# Patient Record
Sex: Female | Born: 1960 | Race: White | Hispanic: No | Smoking: Former smoker
Health system: Southern US, Community
[De-identification: ages and names within clinical notes are randomized; demographics above are authoritative.]

## PROBLEM LIST (undated history)

## (undated) DIAGNOSIS — E785 Hyperlipidemia, unspecified: Secondary | ICD-10-CM

## (undated) DIAGNOSIS — K5900 Constipation, unspecified: Secondary | ICD-10-CM

## (undated) DIAGNOSIS — M1990 Unspecified osteoarthritis, unspecified site: Secondary | ICD-10-CM

## (undated) DIAGNOSIS — R42 Dizziness and giddiness: Secondary | ICD-10-CM

## (undated) DIAGNOSIS — T7840XA Allergy, unspecified, initial encounter: Secondary | ICD-10-CM

## (undated) HISTORY — DX: Constipation, unspecified: K59.00

## (undated) HISTORY — PX: SHOULDER ARTHROSCOPY: SHX128

## (undated) HISTORY — DX: Unspecified osteoarthritis, unspecified site: M19.90

## (undated) HISTORY — PX: BUNIONECTOMY: SHX129

## (undated) HISTORY — DX: Dizziness and giddiness: R42

## (undated) HISTORY — PX: BREAST BIOPSY: SHX20

## (undated) HISTORY — PX: HIP SURGERY: SHX245

## (undated) HISTORY — DX: Allergy, unspecified, initial encounter: T78.40XA

## (undated) HISTORY — PX: COLONOSCOPY: SHX174

## (undated) HISTORY — DX: Hyperlipidemia, unspecified: E78.5

## (undated) HISTORY — PX: CARPAL TUNNEL RELEASE: SHX101

## (undated) HISTORY — PX: TOTAL HIP ARTHROPLASTY: SHX124

---

## 1984-09-05 HISTORY — PX: ABDOMINAL HYSTERECTOMY: SHX81

## 1998-01-07 ENCOUNTER — Inpatient Hospital Stay (HOSPITAL_COMMUNITY): Admission: RE | Admit: 1998-01-07 | Discharge: 1998-01-15 | Payer: Self-pay | Admitting: *Deleted

## 1999-02-04 ENCOUNTER — Other Ambulatory Visit: Admission: RE | Admit: 1999-02-04 | Discharge: 1999-02-04 | Payer: Self-pay | Admitting: Gynecology

## 2000-02-08 ENCOUNTER — Encounter: Admission: RE | Admit: 2000-02-08 | Discharge: 2000-02-08 | Payer: Self-pay | Admitting: Gynecology

## 2000-02-08 ENCOUNTER — Encounter: Payer: Self-pay | Admitting: Gynecology

## 2000-02-21 ENCOUNTER — Other Ambulatory Visit: Admission: RE | Admit: 2000-02-21 | Discharge: 2000-02-21 | Payer: Self-pay | Admitting: Gynecology

## 2000-08-02 ENCOUNTER — Other Ambulatory Visit: Admission: RE | Admit: 2000-08-02 | Discharge: 2000-08-02 | Payer: Self-pay | Admitting: Gastroenterology

## 2000-08-02 ENCOUNTER — Encounter (INDEPENDENT_AMBULATORY_CARE_PROVIDER_SITE_OTHER): Payer: Self-pay | Admitting: Specialist

## 2001-02-26 ENCOUNTER — Encounter: Admission: RE | Admit: 2001-02-26 | Discharge: 2001-02-26 | Payer: Self-pay | Admitting: Gynecology

## 2001-02-26 ENCOUNTER — Encounter: Payer: Self-pay | Admitting: Gynecology

## 2001-02-28 ENCOUNTER — Encounter: Admission: RE | Admit: 2001-02-28 | Discharge: 2001-02-28 | Payer: Self-pay | Admitting: Gynecology

## 2001-02-28 ENCOUNTER — Encounter: Payer: Self-pay | Admitting: Gynecology

## 2002-02-27 ENCOUNTER — Other Ambulatory Visit: Admission: RE | Admit: 2002-02-27 | Discharge: 2002-02-27 | Payer: Self-pay | Admitting: Gynecology

## 2002-03-04 ENCOUNTER — Encounter: Admission: RE | Admit: 2002-03-04 | Discharge: 2002-03-04 | Payer: Self-pay | Admitting: Gynecology

## 2002-03-04 ENCOUNTER — Encounter: Payer: Self-pay | Admitting: Gynecology

## 2003-01-23 ENCOUNTER — Observation Stay (HOSPITAL_COMMUNITY): Admission: RE | Admit: 2003-01-23 | Discharge: 2003-01-24 | Payer: Self-pay | Admitting: Gynecology

## 2003-05-16 ENCOUNTER — Encounter: Admission: RE | Admit: 2003-05-16 | Discharge: 2003-05-16 | Payer: Self-pay | Admitting: Gynecology

## 2003-05-16 ENCOUNTER — Encounter: Payer: Self-pay | Admitting: Gynecology

## 2004-03-01 ENCOUNTER — Other Ambulatory Visit: Admission: RE | Admit: 2004-03-01 | Discharge: 2004-03-01 | Payer: Self-pay | Admitting: Gynecology

## 2004-03-03 ENCOUNTER — Encounter: Admission: RE | Admit: 2004-03-03 | Discharge: 2004-03-03 | Payer: Self-pay | Admitting: Gynecology

## 2005-03-25 ENCOUNTER — Encounter: Admission: RE | Admit: 2005-03-25 | Discharge: 2005-03-25 | Payer: Self-pay | Admitting: Gynecology

## 2005-04-07 ENCOUNTER — Other Ambulatory Visit: Admission: RE | Admit: 2005-04-07 | Discharge: 2005-04-07 | Payer: Self-pay | Admitting: Gynecology

## 2005-06-22 ENCOUNTER — Ambulatory Visit (HOSPITAL_COMMUNITY): Admission: RE | Admit: 2005-06-22 | Discharge: 2005-06-22 | Payer: Self-pay | Admitting: Orthopedic Surgery

## 2006-04-04 ENCOUNTER — Encounter: Admission: RE | Admit: 2006-04-04 | Discharge: 2006-04-04 | Payer: Self-pay | Admitting: Gynecology

## 2006-04-10 ENCOUNTER — Other Ambulatory Visit: Admission: RE | Admit: 2006-04-10 | Discharge: 2006-04-10 | Payer: Self-pay | Admitting: Gynecology

## 2007-03-15 DIAGNOSIS — N6019 Diffuse cystic mastopathy of unspecified breast: Secondary | ICD-10-CM | POA: Insufficient documentation

## 2007-03-19 ENCOUNTER — Ambulatory Visit: Payer: Self-pay | Admitting: Family Medicine

## 2007-03-21 LAB — CONVERTED CEMR LAB
ALT: 17 units/L (ref 0–35)
Albumin: 4 g/dL (ref 3.5–5.2)
Basophils Relative: 0.1 % (ref 0.0–1.0)
Bilirubin, Direct: 0.1 mg/dL (ref 0.0–0.3)
Calcium: 9.3 mg/dL (ref 8.4–10.5)
Cholesterol: 235 mg/dL (ref 0–200)
Direct LDL: 173.2 mg/dL
Eosinophils Absolute: 0 10*3/uL (ref 0.0–0.6)
Eosinophils Relative: 0.2 % (ref 0.0–5.0)
GFR calc Af Amer: 115 mL/min
Glucose, Bld: 96 mg/dL (ref 70–99)
Hemoglobin: 14.9 g/dL (ref 12.0–15.0)
Lymphocytes Relative: 19.6 % (ref 12.0–46.0)
Neutro Abs: 7.1 10*3/uL (ref 1.4–7.7)
Neutrophils Relative %: 76.6 % (ref 43.0–77.0)
RBC: 4.67 M/uL (ref 3.87–5.11)
Sodium: 142 meq/L (ref 135–145)
TSH: 0.88 microintl units/mL (ref 0.35–5.50)
Total CHOL/HDL Ratio: 5.1
Triglycerides: 75 mg/dL (ref 0–149)
WBC: 9.2 10*3/uL (ref 4.5–10.5)

## 2007-03-28 ENCOUNTER — Ambulatory Visit: Payer: Self-pay | Admitting: Family Medicine

## 2007-03-28 DIAGNOSIS — E7800 Pure hypercholesterolemia, unspecified: Secondary | ICD-10-CM | POA: Insufficient documentation

## 2007-04-09 ENCOUNTER — Encounter: Admission: RE | Admit: 2007-04-09 | Discharge: 2007-04-09 | Payer: Self-pay | Admitting: Gynecology

## 2007-04-17 ENCOUNTER — Other Ambulatory Visit: Admission: RE | Admit: 2007-04-17 | Discharge: 2007-04-17 | Payer: Self-pay | Admitting: Gynecology

## 2007-04-30 ENCOUNTER — Ambulatory Visit: Payer: Self-pay | Admitting: Internal Medicine

## 2007-05-02 LAB — CONVERTED CEMR LAB
AST: 17 units/L (ref 0–37)
Cholesterol: 156 mg/dL (ref 0–200)
Triglycerides: 64 mg/dL (ref 0–149)

## 2007-09-17 ENCOUNTER — Ambulatory Visit: Payer: Self-pay | Admitting: Family Medicine

## 2007-09-27 LAB — CONVERTED CEMR LAB
AST: 14 units/L (ref 0–37)
Albumin: 3.7 g/dL (ref 3.5–5.2)
Alkaline Phosphatase: 34 units/L — ABNORMAL LOW (ref 39–117)
Bilirubin, Direct: 0.1 mg/dL (ref 0.0–0.3)
Cholesterol: 151 mg/dL (ref 0–200)
Total Bilirubin: 0.9 mg/dL (ref 0.3–1.2)
VLDL: 9 mg/dL (ref 0–40)

## 2008-03-24 ENCOUNTER — Ambulatory Visit: Payer: Self-pay | Admitting: Family Medicine

## 2008-03-25 LAB — CONVERTED CEMR LAB
ALT: 25 units/L (ref 0–35)
AST: 25 units/L (ref 0–37)
HDL: 42.8 mg/dL (ref >39.0)
Total Protein: 6.6 g/dL (ref 6.0–8.3)
VLDL: 14 mg/dL (ref 0–40)

## 2008-04-09 ENCOUNTER — Encounter: Admission: RE | Admit: 2008-04-09 | Discharge: 2008-04-09 | Payer: Self-pay | Admitting: Gynecology

## 2008-05-01 ENCOUNTER — Encounter (INDEPENDENT_AMBULATORY_CARE_PROVIDER_SITE_OTHER): Payer: Self-pay | Admitting: Internal Medicine

## 2008-09-23 ENCOUNTER — Ambulatory Visit: Payer: Self-pay | Admitting: Family Medicine

## 2008-09-25 LAB — CONVERTED CEMR LAB
Albumin: 4 g/dL (ref 3.5–5.2)
Alkaline Phosphatase: 36 units/L — ABNORMAL LOW (ref 39–117)
Bilirubin, Direct: 0.1 mg/dL (ref 0.0–0.3)
Cholesterol: 154 mg/dL (ref 0–200)
HDL: 47.4 mg/dL (ref >39.0)
Total CHOL/HDL Ratio: 3.2

## 2008-10-17 ENCOUNTER — Ambulatory Visit: Payer: Self-pay | Admitting: Internal Medicine

## 2009-03-30 ENCOUNTER — Ambulatory Visit: Payer: Self-pay | Admitting: Family Medicine

## 2009-03-31 LAB — CONVERTED CEMR LAB
AST: 21 units/L (ref 0–37)
Cholesterol: 142 mg/dL (ref 0–200)
LDL Cholesterol: 86 mg/dL (ref 0–99)
Triglycerides: 48 mg/dL (ref 0.0–149.0)
VLDL: 9.6 mg/dL (ref 0.0–40.0)

## 2009-04-05 LAB — CONVERTED CEMR LAB: Pap Smear: NORMAL

## 2009-04-10 ENCOUNTER — Encounter: Admission: RE | Admit: 2009-04-10 | Discharge: 2009-04-10 | Payer: Self-pay | Admitting: Gynecology

## 2009-05-04 ENCOUNTER — Encounter (INDEPENDENT_AMBULATORY_CARE_PROVIDER_SITE_OTHER): Payer: Self-pay | Admitting: Internal Medicine

## 2009-05-18 ENCOUNTER — Encounter (INDEPENDENT_AMBULATORY_CARE_PROVIDER_SITE_OTHER): Payer: Self-pay | Admitting: Internal Medicine

## 2009-10-05 ENCOUNTER — Ambulatory Visit: Payer: Self-pay | Admitting: Family Medicine

## 2009-10-06 LAB — CONVERTED CEMR LAB: VLDL: 12.2 mg/dL (ref 0.0–40.0)

## 2009-10-16 ENCOUNTER — Ambulatory Visit: Payer: Self-pay | Admitting: Family Medicine

## 2009-10-16 DIAGNOSIS — J019 Acute sinusitis, unspecified: Secondary | ICD-10-CM

## 2009-10-16 DIAGNOSIS — J309 Allergic rhinitis, unspecified: Secondary | ICD-10-CM | POA: Insufficient documentation

## 2009-10-16 LAB — HM COLONOSCOPY: HM Colonoscopy: ABNORMAL

## 2010-04-12 ENCOUNTER — Encounter: Admission: RE | Admit: 2010-04-12 | Discharge: 2010-04-12 | Payer: Self-pay | Admitting: Gynecology

## 2010-04-16 ENCOUNTER — Ambulatory Visit: Payer: Self-pay | Admitting: Family Medicine

## 2010-04-27 LAB — CONVERTED CEMR LAB
Alkaline Phosphatase: 39 units/L (ref 39–117)
Bilirubin, Direct: 0.1 mg/dL (ref 0.0–0.3)
Calcium: 9.6 mg/dL (ref 8.4–10.5)
Chloride: 109 meq/L (ref 96–112)
Cholesterol: 140 mg/dL (ref 0–200)
Creatinine, Ser: 0.7 mg/dL (ref 0.4–1.2)
GFR calc non Af Amer: 89.12 mL/min (ref >60)
HDL: 50.8 mg/dL (ref >39.00)
LDL Cholesterol: 81 mg/dL (ref 0–99)
Potassium: 4.5 meq/L (ref 3.5–5.1)
Sodium: 145 meq/L (ref 135–145)
Total Bilirubin: 0.5 mg/dL (ref 0.3–1.2)
Total CHOL/HDL Ratio: 3
Triglycerides: 42 mg/dL (ref 0.0–149.0)
VLDL: 8.4 mg/dL (ref 0.0–40.0)

## 2010-05-06 ENCOUNTER — Encounter: Payer: Self-pay | Admitting: Family Medicine

## 2010-10-07 NOTE — Letter (Signed)
Summary: Beather Arbour MD  Beather Arbour MD   Imported By: Lanelle Bal 05/19/2010 08:19:50  _____________________________________________________________________  External Attachment:    Type:   Image     Comment:   External Document

## 2010-10-07 NOTE — Assessment & Plan Note (Signed)
Summary: TRANSFER FROM [facility] / [facility] / [facility]   Vital Signs:  Patient profile:   50 year old female Height:      63.5 inches Weight:      163.0 pounds BMI:     28.52 Temp:     98.2 degrees F oral Pulse rate:   68 / minute Pulse rhythm:   regular BP sitting:   90 / 60  (left arm) Cuff size:   regular  Vitals Entered By: Benny Lennert CMA Duncan Dull) (October 16, 2009 10:11 AM)  History of Present Illness: Chief complaint transfer from [facility]  Has had sinus issues this winter...last given antibitoic Amox  and nasal spray in 09/2009...Marland Kitchenhelped some but not completely. Has had congestion constant ,occ bloddy mucus, fraontal pain and sinus pressure. Has gotten worse in last week..face pain.    Preventive Screening-Counseling & Management  Alcohol-Tobacco     Smoking Status: current  Problems Prior to Update: 1)  Hypercholesterolemia, Pure  (ICD-272.0) 2)  Well Woman  (ICD-V70.0) 3)  Colonic Polyps, Benign, 07/2000  (ICD-211.3) 4)  Fibrocystic Breast Disease  (ICD-610.1) 5)  Nicotine Addiction, ? 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 Once Daily 2)  Adult Aspirin Ec Low Strength 81 Mg  Tbec (Aspirin) .... Once Daily 3)  Clarinex 5 Mg  Tabs (Desloratadine) .... As Needed 4)  Mucinex 600 Mg  Tb12 (Guaifenesin) .... As Needed 5)  Zocor 40 Mg  Tabs (Simvastatin) .Marland Kitchen.. 1daily By Mouth 6)  Fish Oil 1200 Mg Caps (Omega-3 Fatty Acids) .Marland Kitchen.. 1 Daily By Mouth 7)  Vitamin D3 1000 Unit Caps (Cholecalciferol) .Marland Kitchen.. 1 Twice A Day By Mouth 8)  Glucosamine-Chondroitin 500-400 Mg Caps (Glucosamine-Chondroitin) .Marland Kitchen.. 1 Twice A Day By Mouth 9)  Estraderm 0.05 Mg/24hr Pttw (Estradiol) .... As Directed 10)  Clarithromycin 500 Mg Tabs (Clarithromycin) .Marland Kitchen.. 1 Tab By Mouth Two Times A Day X 10 Days  Allergies (verified): No Known Drug Allergies  Past History:  Past medical,  surgical, family and social histories (including risk factors) reviewed, and no changes noted (except as noted below).  Past Surgical History: Reviewed history from 05/18/2009 and no changes required. hysterectomy w/ bladder tack 2nd incontinence 1986 colon polyps 4/05 grade 4 cystocele repaired 5/04---11/01 polyps R hip replacement --1989--Harkins R hip banded --1999 R hip bands removed and clean up of joint --10/06--Aluisio DEXA--05/04/2009--[facility], GYN  Family History: Reviewed history and no changes required.  Social History: Reviewed history from 03/19/2007 and no changes required. Marital Status: Married Children: 2--26, son--23, daughtter Occupation: Shipping and receiving Current Smoker..smokes 1/2 pack per day  Review of Systems General:  Denies fatigue and fever. CV:  Denies chest pain or discomfort. Resp:  Denies shortness of breath. GI:  Denies abdominal pain, bloody stools, constipation, and diarrhea. GU:  Denies dysuria. Derm:  Denies lesion(s). Psych:  Denies anxiety and depression.  Physical Exam  General:  Well-developed,well-nourished,in no acute distress; alert,appropriate and cooperative throughout examination Head:  B maxillary tenderness, left greater than right Ears:  clear fluid B TMs Nose:  nasal pallor   Mouth:  Oral mucosa and oropharynx without lesions or exudates.  Teeth in good repair. Neck:  no carotid bruit or thyromegaly no cervical or supraclavicular lymphadenopathy  Lungs:  Normal respiratory effort, chest expands symmetrically. Lungs are clear to auscultation, no crackles or wheezes. Heart:  Normal rate and regular rhythm. S1 and  S2 normal without gallop, murmur, click, rub or other extra sounds. Pulses:  R and L posterior tibial pulses are full and equal bilaterally  Extremities:  no edema   Impression & Recommendations:  Problem # 1:  HYPERCHOLESTEROLEMIA, PURE (ICD-272.0) Well controlled. Continue current medication. Follow 6  months. Her updated medication list for this problem includes:    Niacin Powd (Niacin) ..... Once daily    Zocor 40 Mg Tabs (Simvastatin) .Marland Kitchen... 1daily by mouth  Problem # 2:  SINUSITIS - ACUTE-NOS (ICD-461.9)  Treat with mucinex, nasal saline and antibitoics.  Her updated medication list for this problem includes:    Mucinex 600 Mg Tb12 (Guaifenesin) .Marland Kitchen... As needed    Clarithromycin 500 Mg Tabs (Clarithromycin) .Marland Kitchen... 1 tab by mouth two times a day x 10 days  Instructed on treatment. Call if symptoms persist or worsen.   Complete Medication List: 1)  Niacin Powd (Niacin) .... Once daily 2)  Adult Aspirin Ec Low Strength 81 Mg Tbec (Aspirin) .... Once daily 3)  Clarinex 5 Mg Tabs (Desloratadine) .... As needed 4)  Mucinex 600 Mg Tb12 (Guaifenesin) .... As needed 5)  Zocor 40 Mg Tabs (Simvastatin) .Marland Kitchen.. 1daily by mouth 6)  Fish Oil 1200 Mg Caps (Omega-3 fatty acids) .Marland Kitchen.. 1 daily by mouth 7)  Vitamin D3 1000 Unit Caps (Cholecalciferol) .Marland Kitchen.. 1 twice a day by mouth 8)  Glucosamine-chondroitin 500-400 Mg Caps (Glucosamine-chondroitin) .Marland Kitchen.. 1 twice a day by mouth 9)  Estraderm 0.05 Mg/24hr Pttw (Estradiol) .... As directed 10)  Clarithromycin 500 Mg Tabs (Clarithromycin) .Marland Kitchen.. 1 tab by mouth two times a day x 10 days  Patient Instructions: 1)  Start antibiotic and daily nasal saline irrigation. Call if not improving.  2)  Fasting Lipids, CMET Dx 272.0 in 6 months 3)  Please schedule a follow-up appointment in 1 month CPX.  Prescriptions: CLARITHROMYCIN 500 MG TABS (CLARITHROMYCIN) 1 tab by mouth two times a day x 10 days  #20 x 0   Entered and Authorized by:   Kerby Nora MD   Signed by:   Kerby Nora MD on 10/16/2009   Method used:   Electronically to        [facility]  [address] [phone]* (retail)       [address]       [place], [place]  [address]       Ph: [phone]       Fax: [phone]   RxID:   [id number] ZOCOR 40 MG  TABS (SIMVASTATIN) 1daily by  mouth  #90 x 3   Entered and Authorized by:   Kerby Nora MD   Signed by:   Kerby Nora MD on 10/16/2009   Method used:   [facility] to        [facility]  [address] [phone]* (retail)       [address]       [place], [place]  [address]       Ph: [phone]       Fax: [phone]   RxID:   [id number]   Current Allergies (reviewed today): No known allergies   Flu Vaccine Result Date:  06/05/2009 Flu Vaccine Result:  given Flu Vaccine Next Due:  1 yr Flex Sig Next Due:  Not Indicated Hemoccult Next Due:  Not Indicated PAP Result Date:  04/05/2009 PAP Result:  normal PAP Next Due:  1 yr Mammogram Result Date:  04/05/2009 Mammogram Result:  normal Mammogram Next Due:  1 yr

## 2011-01-21 ENCOUNTER — Ambulatory Visit (AMBULATORY_SURGERY_CENTER): Payer: BC Managed Care – PPO | Admitting: *Deleted

## 2011-01-21 VITALS — Ht 64.0 in | Wt 179.0 lb

## 2011-01-21 DIAGNOSIS — Z1211 Encounter for screening for malignant neoplasm of colon: Secondary | ICD-10-CM

## 2011-01-21 MED ORDER — PEG-KCL-NACL-NASULF-NA ASC-C 100 G PO SOLR: ORAL | Status: DC

## 2011-01-21 NOTE — Op Note (Signed)
   NAME:  Veronica Hawkins, Veronica Hawkins                       ACCOUNT NO.:  [id number]   MEDICAL RECORD NO.:  [id number]                   PATIENT TYPE:  OBV   LOCATION:  9310                                 FACILITY:  [facility]   PHYSICIAN:  Gretta Cool, M.D.              DATE OF BIRTH:  06-29-1961   DATE OF PROCEDURE:  01/23/2003  DATE OF DISCHARGE:                                 OPERATIVE REPORT   PREOPERATIVE DIAGNOSES:  Grade 4 cystocele with 4 cm central fascial defect.   POSTOPERATIVE DIAGNOSES:  Grade 4 cystocele with 4 cm central fascial  defect.   PROCEDURE:  Anterior vaginal wall repair.   SURGEON:  Gretta Cool, M.D.   ASSISTANT:  Raynald Kemp, M.D.   ANESTHESIA:  General orotracheal.   DESCRIPTION OF PROCEDURE:  Under excellent general anesthesia as above with  the patient prepped and draped in lithotomy position her hip carefully  placed while she was still awake so as to avoid any injury with her history  of two hip replacements.  She was very comfortable in the position she was  placed in before she had any sedation or anesthesia.  At this point the  vagina was grasped at the apex of the cuff with Allis clamps and an incision  made transversely from uterosacral dimple to right to left.  The mucosa was  then undermine all the way to the vesical neck and the mucosa reflected by  blunt and sharp dissection in the pelvic fascia.  Once the mucosa was  adequately reflected the large defect was outlined and repaired with a  series of purse-string sutures and then with interrupted mattress sutures so  as to plicate the fascial defect in multiple layers.  The bladder pillars  were then identified and sutured to the uterosacral and to each other in the  midline.  At this point the mucosa was trimmed and the mucosa closed as a  subcuticular closure including the edges of the mucosa and the upper layers  of endopelvic fascia.  At this point the procedure was terminated without  complications.  The Bonanno suprapubic Cystocath was placed with  approximately 400 mL in her bladder and secured with 0 nylon.  At this point  the procedure was terminated without complications.  The patient returned to  recovery room in excellent condition.                                               Gretta Cool, M.D.    CWL/MEDQ  D:  01/23/2003  T:  01/23/2003  Job:  [id number]   cc:   Laurita Quint, M.D.  [address] [place]  [place] [address]  Fax: [phone]

## 2011-01-21 NOTE — Op Note (Signed)
NAME:  Veronica Hawkins, Veronica Hawkins             ACCOUNT NO.:  [id number]   MEDICAL RECORD NO.:  [id number]          PATIENT TYPE:  AMB   LOCATION:  DAY                          [facility]   PHYSICIAN:  Ollen Gross, M.D.    DATE OF BIRTH:  Mar 01, 1961   DATE OF PROCEDURE:  06/22/2005  DATE OF DISCHARGE:                                 OPERATIVE REPORT   PREOPERATIVE DIAGNOSIS:  Painful hardware right hip.   POSTOPERATIVE DIAGNOSIS:  Painful hardware right hip.   PROCEDURE:  Hardware removal right hip.   SURGEON:  Ollen Gross, M.D.   ASSISTANT:  Avel Peace, P.A.-C.   ANESTHESIA:  General.   ESTIMATED BLOOD LOSS:  Minimal.   DRAINS:  Hemovac x1.   COMPLICATIONS:  None.   CONDITION:  Stable to recovery.   BRIEF CLINICAL NOTE:  [name] is a 50 year old female who had a right total  hip arthroplasty done several years ago. She had to have trochanteric  hardware placed for a trochanteric fracture. She has had significant pain in  relation to the hardware and has had a cable break. She presents now for  hardware removal secondary to the pain.   PROCEDURE IN DETAIL:  After successful administration of general anesthetic,  the patient was  placed in the left lateral decubitus position with the  right side up and held with a hip positioner. The right lower extremity was  isolated from her perineum with plastic drapes and prepped and draped in the  usual sterile fashion. The distal 4 inches of her incision is utilized and  the skin cut with a 10 blade through the subcutaneous tissue to the level of  the fascia lata which was incised in line with the skin incision. Some  bursal fluid was identified after going through the fascia lata. The cable  grip is identified on the greater trochanter. The trochanter was found to be  united. I cut two of the cables. I was able to then remove the grip from the  bone. There was a broken piece of cable removed and two small shards of  cable found.  These were all removed. The cable grip and the long cable was  removed. All the hardware was subsequently out. There was some bursal tissue  which is also excised. The trochanter is completely united. The Wound is  then copiously irrigated with saline solution and then the fascia lata  closed over a Hemovac  drain with interrupted #1 Vicryl. The subcu closed #1 and #2-0 Vicryl and  skin with staples. Incisions cleaned and dried and a bulky sterile dressing  applied. The drain is hooked to suction and she is awakened and transferred  to recovery in stable condition.      Ollen Gross, M.D.  Electronically Signed     FA/MEDQ  D:  06/22/2005  T:  06/22/2005  Job:  [id number]

## 2011-01-21 NOTE — H&P (Signed)
NAME:  Veronica Hawkins, Veronica Hawkins                       ACCOUNT NO.:  [id number]   MEDICAL RECORD NO.:  [id number]                   PATIENT TYPE:  OBV   LOCATION:  9310                                 FACILITY:  [facility]   PHYSICIAN:  Gretta Cool, M.D.              DATE OF BIRTH:  04-10-1961   DATE OF ADMISSION:  01/23/2003  DATE OF DISCHARGE:                                HISTORY & PHYSICAL   CHIEF COMPLAINT:  Pelvic support problems.   HISTORY OF PRESENT ILLNESS:  A 50 year old gravida 2, para 2 with history of  hysterectomy, Burch procedure and uterosacral placation in 1986.  Now with  recurrence of anterior vaginal wall support weakness with a bulge of vagina  through the introitus.  She has no incontinence issues, no urgency or stress  leakage but she does have a bulge of either anterior enterocele or cystocele  with a huge 4 cm fascial defect of the anterior vaginal wall in the vagina.  She is now admitted for definitive therapy.   PAST MEDICAL HISTORY:  Usual childhood disease without sequelae.   Medical illnesses, none.   PAST SURGICAL HISTORY:  1. Hip replacement x2.  The most recent May 1999.  2. History of hysterectomy in 1986.   HABITS:  One pack daily.  Denies ethanol or recreational drugs.   SOCIAL HISTORY:  Patient is married.  Works at [employer], shipping-  receiving.  She has two children.  __________ above.   FAMILY HISTORY:  Father has hypertension.  Maternal grandfather MI.  No  other admitted familial tendency.   REVIEW OF SYSTEMS:  HEENT:  Denies symptoms.  CARDIORESPIRATORY:  Denies  asthma, cough, bronchitis, shortness of breath.  GI/GU:  Denies frequency,  urgency, dysuria, change in bowel habits, food intolerance.   PHYSICAL EXAMINATION:  GENERAL:  A well-developed, well-nourished white  female considerably over her ideal weight at 190 and 5 feet 4 inches with  high abdomen to hip ratio.  General exam is otherwise unremarkable.  HEENT:  Pupils  are equal and reactive to light and accommodate.  Fundi not  examined.  Oropharynx clear.  NECK:  Supple without masses, thyroid enlargement.  CHEST:  Clear P-A.  HEART:  Regular rhythm without murmur or cardiac enlargement.  BREASTS:  Without mass, nodes, nipple discharge.  ABDOMEN:  Soft with increased paniculus but without mass or organomegaly  palpable.  PELVIC:  External genitalia normal female.  Vagina adequate estrogen effect.  There is massive distention of the anterior vaginal wall from a 4 cm defect  near the apex of the vaginal cuff with a bulge of bladder through the  introitus with straining.  The defect appears to extend all the way to the  apex of the vaginal cuff.  The apex of the cuff is well supported, posterior  support is quite excellent.  Rectovaginal exam confirms.  Cervic and uterus  surgically absent.   IMPRESSION:  1. Right cystocele with history of total abdominal hysterectomy, Burch     culdoplasty.  2. Congenital hip dysplasia now with replacement times two.  3. 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  4. Tobacco use.   RECOMMENDATIONS:  1. Onto repair of central anterior fascial weakness, anterior vaginal wall     by anterior vaginal wall repair.  2. She is to curtail smoking throughout her period of healing.  3. Avoid straining at stool or any other lifting or straining till beyond 12     weeks.  4. No heavy lifting or straining ever.                                                  Gretta Cool, M.D.    CWL/MEDQ  D:  01/22/2003  T:  01/23/2003  Job:  [id number]   cc:   Laurita Quint, M.D.  [address] [place]  [place] [address]  Fax: [phone]

## 2011-01-27 ENCOUNTER — Other Ambulatory Visit: Payer: Self-pay | Admitting: Family Medicine

## 2011-02-03 ENCOUNTER — Encounter: Payer: Self-pay | Admitting: Gastroenterology

## 2011-02-03 ENCOUNTER — Ambulatory Visit (AMBULATORY_SURGERY_CENTER): Payer: BC Managed Care – PPO | Admitting: Gastroenterology

## 2011-02-03 VITALS — BP 134/77 | HR 63 | Temp 97.9°F | Resp 18 | Ht 64.0 in | Wt 179.0 lb

## 2011-02-03 DIAGNOSIS — Z1211 Encounter for screening for malignant neoplasm of colon: Secondary | ICD-10-CM

## 2011-02-03 MED ORDER — SODIUM CHLORIDE 0.9 % IV SOLN: 500.0000 mL | INTRAVENOUS | Status: DC

## 2011-02-03 NOTE — Patient Instructions (Signed)
Discharged  Instructions given verbal understanding. Normal examination. Resume previous medications.

## 2011-02-04 ENCOUNTER — Telehealth: Payer: Self-pay | Admitting: *Deleted

## 2011-02-04 NOTE — Telephone Encounter (Signed)

## 2011-04-07 ENCOUNTER — Other Ambulatory Visit: Payer: Self-pay | Admitting: Gynecology

## 2011-04-07 DIAGNOSIS — Z1231 Encounter for screening mammogram for malignant neoplasm of breast: Secondary | ICD-10-CM

## 2011-04-18 ENCOUNTER — Ambulatory Visit
Admission: RE | Admit: 2011-04-18 | Discharge: 2011-04-18 | Disposition: A | Discharge status: Home / Self Care | Payer: BC Managed Care – PPO | Source: Ambulatory Visit | Attending: Gynecology | Admitting: Gynecology

## 2011-04-18 DIAGNOSIS — Z1231 Encounter for screening mammogram for malignant neoplasm of breast: Secondary | ICD-10-CM

## 2011-05-30 ENCOUNTER — Telehealth: Payer: Self-pay | Admitting: Family Medicine

## 2011-05-30 ENCOUNTER — Other Ambulatory Visit (INDEPENDENT_AMBULATORY_CARE_PROVIDER_SITE_OTHER): Payer: BC Managed Care – PPO

## 2011-05-30 ENCOUNTER — Other Ambulatory Visit: Payer: Self-pay | Admitting: Gynecology

## 2011-05-30 DIAGNOSIS — E7800 Pure hypercholesterolemia, unspecified: Secondary | ICD-10-CM

## 2011-05-30 LAB — COMPREHENSIVE METABOLIC PANEL
Albumin: 4.1 g/dL (ref 3.5–5.2)
Alkaline Phosphatase: 40 U/L (ref 39–117)
BUN: 12 mg/dL (ref 6–23)
Calcium: 9 mg/dL (ref 8.4–10.5)
Creatinine, Ser: 0.9 mg/dL (ref 0.4–1.2)
Glucose, Bld: 110 mg/dL — ABNORMAL HIGH (ref 70–99)
Potassium: 4.3 mEq/L (ref 3.5–5.1)

## 2011-05-30 LAB — LIPID PANEL
Cholesterol: 155 mg/dL (ref 0–200)
Triglycerides: 112 mg/dL (ref 0.0–149.0)
VLDL: 22.4 mg/dL (ref 0.0–40.0)

## 2011-05-30 NOTE — Telephone Encounter (Signed)
Message copied by Excell Seltzer on Mon May 30, 2011 11:18 AM ------      Message from: Baldomero Lamy      Created: Fri May 27, 2011  1:17 PM      Regarding: cpx labs mon 9/24       Please order  future cpx labs for pt's upcomming lab appt.      Thanks      Rodney Booze

## 2011-05-30 NOTE — Telephone Encounter (Signed)
Message copied by BEDSOLE, AMY E on Mon May 30, 2011 11:18 AM ------      Message from: CHAVERS, NATASHA C      Created: Fri May 27, 2011  1:17 PM      Regarding: cpx labs mon 9/24       Please order  future cpx labs for pt's upcomming lab appt.      Thanks      Tasha       

## 2011-06-09 ENCOUNTER — Encounter: Payer: Self-pay | Admitting: Family Medicine

## 2011-06-10 ENCOUNTER — Encounter: Payer: Self-pay | Admitting: Family Medicine

## 2011-06-10 ENCOUNTER — Ambulatory Visit (INDEPENDENT_AMBULATORY_CARE_PROVIDER_SITE_OTHER): Payer: BC Managed Care – PPO | Admitting: Family Medicine

## 2011-06-10 DIAGNOSIS — R7303 Prediabetes: Secondary | ICD-10-CM | POA: Insufficient documentation

## 2011-06-10 DIAGNOSIS — R7309 Other abnormal glucose: Secondary | ICD-10-CM

## 2011-06-10 DIAGNOSIS — R252 Cramp and spasm: Secondary | ICD-10-CM

## 2011-06-10 DIAGNOSIS — J302 Other seasonal allergic rhinitis: Secondary | ICD-10-CM | POA: Insufficient documentation

## 2011-06-10 DIAGNOSIS — J309 Allergic rhinitis, unspecified: Secondary | ICD-10-CM

## 2011-06-10 DIAGNOSIS — E7800 Pure hypercholesterolemia, unspecified: Secondary | ICD-10-CM

## 2011-06-10 MED ORDER — FLUTICASONE PROPIONATE 50 MCG/ACT NA SUSP: 2.0000 | Freq: Every day | NASAL | Status: DC

## 2011-06-10 NOTE — Progress Notes (Signed)
  Subjective:    Patient ID: Veronica Hawkins, female    DOB: November 01, 1960, 50 y.o.   MRN: [id number]  HPI  She sees GYN for yearly CPX. Hysterectomy w/ bladder tack 2nd incontinence 1986   Has been having leg and foot cramps...ongoing for years. Trying tonic water, no relief. Mainly occuring at night. No creepy crawly feeling. Minimal water intake.  Allergies. Poor control:using claritin daily. In last few weeks has had worsening of congestion, facial pain. Occ bloody mucus, not green. No fever, no ear pain.  Elevated Cholesterol:Well controlled. Using medications without problems: Muscle aches: None Diet compliance:Good. Exercise:walking Has gained 20 lbs , but stopped smoking. Other complaints:     Review of Systems  Constitutional: Negative for fever and fatigue.  HENT: Negative for ear pain.   Eyes: Negative for pain.  Respiratory: Negative for chest tightness and shortness of breath.   Cardiovascular: Negative for chest pain, palpitations and leg swelling.  Gastrointestinal: Negative for abdominal pain.  Genitourinary: Negative for dysuria.       Objective:   Physical Exam  Constitutional: Vital signs are normal. She appears well-developed and well-nourished. She is cooperative.  Non-toxic appearance. She does not appear ill. No distress.  HENT:  Head: Normocephalic.  Right Ear: Hearing, tympanic membrane, external ear and ear canal normal.  Left Ear: Hearing, tympanic membrane, external ear and ear canal normal.  Nose: Nose normal.  Eyes: Conjunctivae, EOM and lids are normal. Pupils are equal, round, and reactive to light. No foreign bodies found.  Neck: Trachea normal and normal range of motion. Neck supple. Carotid bruit is not present. No mass and no thyromegaly present.  Cardiovascular: Normal rate, regular rhythm, S1 normal, S2 normal, normal heart sounds and intact distal pulses.  Exam reveals no gallop.   No murmur heard.      B varicose veins.    Pulmonary/Chest: Effort normal and breath sounds normal. No respiratory distress. She has no wheezes. She has no rhonchi. She has no rales.  Abdominal: Soft. Normal appearance and bowel sounds are normal. She exhibits no distension, no fluid wave, no abdominal bruit and no mass. There is no hepatosplenomegaly. There is no tenderness. There is no rebound, no guarding and no CVA tenderness. No hernia.  Lymphadenopathy:    She has no cervical adenopathy.    She has no axillary adenopathy.  Neurological: She is alert. She has normal strength. No cranial nerve deficit or sensory deficit.  Skin: Skin is warm, dry and intact. No rash noted.  Psychiatric: Her speech is normal and behavior is normal. Judgment normal. Her mood appears not anxious. Cognition and memory are normal. She does not exhibit a depressed mood.          Assessment & Plan:  The patient's preventative maintenance and recommended screening tests for an annual wellness exam were reviewed in full today. Brought up to date unless services declined.  Counselled on the importance of diet, exercise, and its role in overall health and mortality. The patient's FH and SH was reviewed, including their home life, tobacco status, and drug and alcohol status.   Pelvic/breast exam done at GYN MAmmogram/ DXA: done at gyn, last pone 2010, normal. Colon: hx of polyps, last colonoscopy 01/2011...repeat 10 years Tdap:? Flu: plans to get at work

## 2011-06-10 NOTE — Assessment & Plan Note (Signed)
Encouraged exercise, weight loss, healthy eating habits. ? ?

## 2011-06-10 NOTE — Assessment & Plan Note (Signed)
Cahnge to zyrtec, add nasal steroid spray, nasal saline. If not improving call.

## 2011-06-10 NOTE — Assessment & Plan Note (Signed)
Likely due to chronic mild dehydration. Increase fluids, stretching, may try mustard.

## 2011-06-10 NOTE — Patient Instructions (Addendum)
Increase water... 32-48 oz of water a day. Can try yellow mustard for cramping. You could try 1 week trial off statin medication to see if cramps improve.Marland Kitchen Restart if they don't. Increase exercise, stretch as well. Work on weight loss and low carb diet. Change allergy med to OTC zyrtec (okay to get generic) at bedtime, nasal saline irrigation 3-4 times a day and nasal steroid spray 2 daily before saline. Call if symptoms not improving in next 1-2 weeks or if fever, or unilateral sinus pain.

## 2011-06-10 NOTE — Assessment & Plan Note (Signed)
Great control..The current medical regimen is effective;  continue present plan and medications. Current med... Can try trial off to see if causing muscle issues.

## 2012-02-06 ENCOUNTER — Other Ambulatory Visit: Payer: Self-pay | Admitting: Family Medicine

## 2012-03-22 ENCOUNTER — Other Ambulatory Visit: Payer: Self-pay | Admitting: Gynecology

## 2012-03-22 DIAGNOSIS — Z1231 Encounter for screening mammogram for malignant neoplasm of breast: Secondary | ICD-10-CM

## 2012-04-18 ENCOUNTER — Ambulatory Visit
Admission: RE | Admit: 2012-04-18 | Discharge: 2012-04-18 | Disposition: A | Discharge status: Home / Self Care | Payer: BC Managed Care – PPO | Source: Ambulatory Visit | Attending: Gynecology | Admitting: Gynecology

## 2012-04-18 DIAGNOSIS — Z1231 Encounter for screening mammogram for malignant neoplasm of breast: Secondary | ICD-10-CM

## 2012-06-22 ENCOUNTER — Other Ambulatory Visit: Payer: Self-pay | Admitting: Gynecology

## 2012-07-13 ENCOUNTER — Telehealth: Payer: Self-pay | Admitting: Family Medicine

## 2012-07-13 ENCOUNTER — Other Ambulatory Visit (INDEPENDENT_AMBULATORY_CARE_PROVIDER_SITE_OTHER): Payer: BC Managed Care – PPO

## 2012-07-13 DIAGNOSIS — R7303 Prediabetes: Secondary | ICD-10-CM

## 2012-07-13 DIAGNOSIS — R7309 Other abnormal glucose: Secondary | ICD-10-CM

## 2012-07-13 DIAGNOSIS — E7800 Pure hypercholesterolemia, unspecified: Secondary | ICD-10-CM

## 2012-07-13 LAB — LIPID PANEL
LDL Cholesterol: 79 mg/dL (ref 0–99)
Total CHOL/HDL Ratio: 3
Triglycerides: 61 mg/dL (ref 0.0–149.0)

## 2012-07-13 LAB — COMPREHENSIVE METABOLIC PANEL
ALT: 17 U/L (ref 0–35)
AST: 16 U/L (ref 0–37)
Albumin: 3.9 g/dL (ref 3.5–5.2)
Alkaline Phosphatase: 39 U/L (ref 39–117)
Potassium: 4.6 mEq/L (ref 3.5–5.1)
Sodium: 141 mEq/L (ref 135–145)
Total Protein: 6.6 g/dL (ref 6.0–8.3)

## 2012-07-13 NOTE — Telephone Encounter (Signed)
Message copied by Kerby Nora E on Fri Jul 13, 2012 10:30 AM ------      Message from: Baldomero Lamy      Created: Thu Jul 05, 2012  2:01 PM      Regarding: labs Fri 07/13/12       Please order  future cpx labs for pt's upcomming lab appt.      Thanks      Rodney Booze

## 2012-07-27 ENCOUNTER — Ambulatory Visit (INDEPENDENT_AMBULATORY_CARE_PROVIDER_SITE_OTHER)
Admission: RE | Admit: 2012-07-27 | Discharge: 2012-07-27 | Disposition: A | Discharge status: Home / Self Care | Payer: BC Managed Care – PPO | Source: Ambulatory Visit | Attending: Family Medicine | Admitting: Family Medicine

## 2012-07-27 ENCOUNTER — Encounter: Payer: Self-pay | Admitting: Family Medicine

## 2012-07-27 ENCOUNTER — Ambulatory Visit (INDEPENDENT_AMBULATORY_CARE_PROVIDER_SITE_OTHER): Payer: BC Managed Care – PPO | Admitting: Family Medicine

## 2012-07-27 VITALS — BP 130/92 | HR 63 | Temp 98.3°F | Ht 64.0 in | Wt 181.5 lb

## 2012-07-27 DIAGNOSIS — Z87891 Personal history of nicotine dependence: Secondary | ICD-10-CM

## 2012-07-27 DIAGNOSIS — G5603 Carpal tunnel syndrome, bilateral upper limbs: Secondary | ICD-10-CM

## 2012-07-27 DIAGNOSIS — R7303 Prediabetes: Secondary | ICD-10-CM

## 2012-07-27 DIAGNOSIS — G5600 Carpal tunnel syndrome, unspecified upper limb: Secondary | ICD-10-CM

## 2012-07-27 DIAGNOSIS — Z23 Encounter for immunization: Secondary | ICD-10-CM

## 2012-07-27 DIAGNOSIS — E7800 Pure hypercholesterolemia, unspecified: Secondary | ICD-10-CM

## 2012-07-27 DIAGNOSIS — R7309 Other abnormal glucose: Secondary | ICD-10-CM

## 2012-07-27 DIAGNOSIS — M545 Low back pain, unspecified: Secondary | ICD-10-CM

## 2012-07-27 NOTE — Assessment & Plan Note (Signed)
Well controlleld on current med.

## 2012-07-27 NOTE — Progress Notes (Signed)
Subjective:    Patient ID: Galen Daft, female    DOB: 03-23-1961, 51 y.o.   MRN: [id number]  HPI  51 year old presents for yearly follow up.  She sees GYN for yearly CPX. .. 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 Other complaints:  Prediabetes: resolved.  Low back pain in last month. Bilateral Does heavy lifting but no specific injury.   She dis fall several months ago, but did not hurt back. No numbness in legs, no weakness. Has not taken any med other than meloxicam for arthritis.  Hands fall asleep at night. One side and then the other when she lies on that side. No issue during the day. No weakness in hands, mild grip strength decrease.  Has been going on for  > than 1 year. On computer during the day, packs for shipping some.   BP initially elevated today, but normally well controlleld. On repeat 120/84.  No decongestant.  Review of Systems  Constitutional: Negative for fever, fatigue and unexpected weight change.  HENT: Negative for ear pain, congestion, sore throat, sneezing, trouble swallowing and sinus pressure.   Eyes: Negative for pain and itching.  Respiratory: Negative for cough, shortness of breath and wheezing.   Cardiovascular: Negative for chest pain, palpitations and leg swelling.  Gastrointestinal: Negative for nausea, abdominal pain, diarrhea, constipation and blood in stool.  Genitourinary: Negative for dysuria, hematuria, vaginal discharge, difficulty urinating and menstrual problem.  Skin: Negative for rash.  Neurological: Negative for syncope, weakness,  light-headedness, numbness and headaches.  Psychiatric/Behavioral: Negative for confusion and dysphoric mood. The patient is not nervous/anxious.        Objective:   Physical Exam  Constitutional: Vital signs are normal. She appears well-developed and well-nourished. She is cooperative.  Non-toxic appearance. She does not appear ill. No distress.  HENT:  Head: Normocephalic.  Right Ear: Hearing, tympanic membrane, external ear and ear canal normal.  Left Ear: Hearing, tympanic membrane, external ear and ear canal normal.  Nose: Nose normal.  Eyes: Conjunctivae normal, EOM and lids are normal. Pupils are equal, round, and reactive to light. No foreign bodies found.  Neck: Trachea normal and normal range of motion. Neck supple. Carotid bruit is not present. No mass and no thyromegaly present.  Cardiovascular: Normal rate, regular rhythm, S1 normal, S2 normal, normal heart sounds and intact distal pulses.  Exam reveals no gallop.   No murmur heard. Pulmonary/Chest: Effort normal and breath sounds normal. No respiratory distress. She has no wheezes. She has no rhonchi. She has no rales.  Abdominal: Soft. Normal appearance and bowel sounds are normal. She exhibits no distension, no fluid wave, no abdominal bruit and no mass. There is no hepatosplenomegaly. There is no tenderness. There is no rebound, no guarding and no CVA tenderness. No hernia.  Musculoskeletal:       Lumbar back: She exhibits decreased range of motion and tenderness. She exhibits no bony tenderness and no swelling.       Neg faber's, neg SLR.  B tinel and phalen positive. Mildly positive ulnar nerve compression on right  Lymphadenopathy:    She has  no cervical adenopathy.    She has no axillary adenopathy.  Neurological: She is alert. She has normal strength. No cranial nerve deficit or sensory deficit.  Skin: Skin is warm, dry and intact. No rash noted.  Psychiatric: Her speech is normal and behavior is normal. Judgment  normal. Her mood appears not anxious. Cognition and memory are normal. She does not exhibit a depressed mood.          Assessment & Plan:  The patient's preventative maintenance and recommended screening tests for an annual wellness exam were reviewed in full today. Brought up to date unless services declined.  Counselled on the importance of diet, exercise, and its role in overall health and mortality. The patient's FH and SH was reviewed, including their home life, tobacco status, and drug and alcohol status.   Vaccines: Got Flu at work, last Tdap . Former smoker  >25 pack year history. Asymptomatic, never had spirometry, last CXR 2010 nml.. Will check both today PAP/DVE: per GYN: nml in 06/2012 Mammo nml 05/2012 Colon: nml 2011, recommnded repeat in 10 years, Dr. Russella Dar.

## 2012-07-27 NOTE — Assessment & Plan Note (Signed)
B wrist brace with stays at night.

## 2012-07-27 NOTE — Patient Instructions (Addendum)
Try to increase exercise and work on weight loss.  We will call with results of Chest X-ary. Low back stretches, heat and massage. Follow up if not improving in 2 weeks. Wear B carpal tunnel braces at night.

## 2012-07-27 NOTE — Assessment & Plan Note (Signed)
Resolved with diet 

## 2012-07-27 NOTE — Assessment & Plan Note (Signed)
Muscular strain. Treat with NSAIDs (already on meloxicam), heat massage and stretching exercises.

## 2012-07-31 ENCOUNTER — Encounter: Payer: BC Managed Care – PPO | Admitting: Family Medicine

## 2012-08-07 ENCOUNTER — Other Ambulatory Visit: Payer: Self-pay | Admitting: Family Medicine

## 2012-08-28 ENCOUNTER — Encounter: Payer: BC Managed Care – PPO | Admitting: Family Medicine

## 2013-01-02 ENCOUNTER — Ambulatory Visit (INDEPENDENT_AMBULATORY_CARE_PROVIDER_SITE_OTHER): Payer: BC Managed Care – PPO | Admitting: Family Medicine

## 2013-01-02 ENCOUNTER — Encounter: Payer: Self-pay | Admitting: Family Medicine

## 2013-01-02 VITALS — BP 120/80 | HR 73 | Temp 98.8°F | Ht 64.0 in | Wt 181.0 lb

## 2013-01-02 DIAGNOSIS — F411 Generalized anxiety disorder: Secondary | ICD-10-CM

## 2013-01-02 DIAGNOSIS — F419 Anxiety disorder, unspecified: Secondary | ICD-10-CM

## 2013-01-02 DIAGNOSIS — R200 Anesthesia of skin: Secondary | ICD-10-CM

## 2013-01-02 DIAGNOSIS — R51 Headache: Secondary | ICD-10-CM

## 2013-01-02 DIAGNOSIS — R519 Headache, unspecified: Secondary | ICD-10-CM | POA: Insufficient documentation

## 2013-01-02 DIAGNOSIS — R209 Unspecified disturbances of skin sensation: Secondary | ICD-10-CM

## 2013-01-02 MED ORDER — ALPRAZOLAM 0.5 MG PO TABS: 0.5000 mg | ORAL_TABLET | Freq: Every evening | ORAL | Status: DC | PRN

## 2013-01-02 NOTE — Progress Notes (Signed)
Subjective:    Patient ID: Galen Daft, female    DOB: [birth year], 52 y.o.   MRN: [id number]  HPI Here with a headache and tingling in head/ face  This has gone on for about a week  Tingling is the whole R side of face - and sometimes ext to the L side of chin No loss of sensation and loss of motor function  Neck makes creaky sound  The headache is dull  Is frontal - both sides , not throbbing / located behind her eyes  Has allergies - has been in ok control -no sinus pain  No vision change at all   No hx of migraines  Caffeine- 1 c coffee in am and 1 soda max  Not getting enough sleep -insomnia  Menopausal - has an estrogen patch     She fell 3 weeks ago and landed on her butt and knee - no head injury   Also panic attacks  When she feels the tingling - she gets very anxious and panicky  Does not feel like her normal self  This is new  Usually a laid back person  Has had significant stress - lost 2 uncles and family friend in 2 weeks - this may be affecting her   Patient Active Problem List   Diagnosis Date Noted  . Low back pain 07/27/2012  . Bilateral carpal tunnel syndrome 07/27/2012  . Prediabetes 06/10/2011  . Seasonal allergies 06/10/2011  . HYPERCHOLESTEROLEMIA, PURE 03/28/2007  . FIBROCYSTIC BREAST DISEASE 03/15/2007   Past Medical History  Diagnosis Date  . Allergy     seasonal  . Arthritis   . Hyperlipidemia    Past Surgical History  Procedure Laterality Date  . Total hip arthroplasty  [id number]    Right hip  . Hip surgery      right to remove bands  . Shoulder arthroscopy      left ligaments and tendon repair and spurs  . Abdominal hysterectomy  1986  . Colonoscopy     History  Substance Use Topics  . Smoking status: Former Smoker    Types: Cigarettes    Quit date: 03/16/2010  . Smokeless tobacco: Never Used  . Alcohol Use: No   No family history on file. No Known Allergies Current Outpatient Prescriptions on File Prior to Visit   Medication Sig Dispense Refill  . cetirizine (ZYRTEC) 10 MG tablet Take 10 mg by mouth daily.        Marland Kitchen estradiol (ESTRACE) 0.1 MG/GM vaginal cream Place 2 g vaginally. 2 times weekly      . estradiol (VIVELLE-DOT) 0.1 MG/24HR Place 1 patch onto the skin 2 (two) times a week. Takes 1/2 patch twice a week       . fluticasone (FLONASE) 50 MCG/ACT nasal spray 2 SPRAYS INTO THE NOSTRIL(S) DAILY  16 g  5  . LevOCARNitine Fumarate (L-CARNITINE) 200 MG CAPS Take 1 capsule by mouth daily.        . meloxicam (MOBIC) 15 MG tablet Take 15 mg by mouth daily.      . simvastatin (ZOCOR) 40 MG tablet TAKE 1 TABLET BY MOUTH DAILY  90 tablet  3   No current facility-administered medications on file prior to visit.    Review of Systems Review of Systems  Constitutional: Negative for fever, appetite change, fatigue and unexpected weight change.  Eyes: Negative for pain and visual disturbance.  Respiratory: Negative for cough and shortness of breath.   Cardiovascular: Negative for  cp or palpitations    Gastrointestinal: Negative for nausea, diarrhea and constipation.  Genitourinary: Negative for urgency and frequency.  Skin: Negative for pallor or rash   Neurological: Negative for weakness, light-headedness, and pos for headache  Hematological: Negative for adenopathy. Does not bruise/bleed easily.  Psychiatric/Behavioral: Negative for dysphoric mood. The patient is not nervous/anxious.         Objective:   Physical Exam  Constitutional: She appears well-developed and well-nourished. No distress.  obese and well appearing   HENT:  Head: Normocephalic and atraumatic.  Right Ear: External ear normal.  Left Ear: External ear normal.  Nose: Nose normal.  Mouth/Throat: Oropharynx is clear and moist. No oropharyngeal exudate.  Mild tenderness of temples bilaterally No sinus tenderness  Eyes: Conjunctivae and EOM are normal. Pupils are equal, round, and reactive to light. Right eye exhibits no discharge.  Left eye exhibits no discharge. No scleral icterus.  Neck: Normal range of motion. Neck supple. No JVD present. Carotid bruit is not present. No thyromegaly present.  Cardiovascular: Normal rate, regular rhythm, normal heart sounds and intact distal pulses.  Exam reveals no gallop.   Pulmonary/Chest: Effort normal and breath sounds normal. No respiratory distress. She has no wheezes.  Abdominal: Soft. Bowel sounds are normal. She exhibits no distension, no abdominal bruit and no mass. There is no tenderness.  Musculoskeletal: She exhibits no edema and no tenderness.  Lymphadenopathy:    She has no cervical adenopathy.  Neurological: She is alert. She has normal strength and normal reflexes. She displays no atrophy and no tremor. No cranial nerve deficit or sensory deficit. She exhibits normal muscle tone. She displays a negative Romberg sign. Coordination and gait normal.  No cerebellar signs Nl tandem gait  Skin: Skin is warm and dry. No rash noted. No erythema. No pallor.  Psychiatric: She has a normal mood and affect.          Assessment & Plan:

## 2013-01-02 NOTE — Patient Instructions (Signed)
Use warm compress on your neck to relax muscles  Let me know at any time if you are interested in a counselor for stress  Try xanax at night for the next 2 weeks to get back into a normal sleep cycle Lab today - checking a sed rate to rule out temporal arteritis (doubtful but need to check for that) If worse symptoms let me know or if not improving over the next several weeks

## 2013-01-03 LAB — CBC WITH DIFFERENTIAL/PLATELET
Basophils Absolute: 0.1 10*3/uL (ref 0.0–0.1)
Lymphocytes Relative: 19.5 % (ref 12.0–46.0)
Monocytes Relative: 5.3 % (ref 3.0–12.0)
Platelets: 224 10*3/uL (ref 150.0–400.0)
RDW: 13.3 % (ref 11.5–14.6)

## 2013-01-03 LAB — SEDIMENTATION RATE: Sed Rate: 15 mm/hr (ref 0–22)

## 2013-01-03 NOTE — Assessment & Plan Note (Signed)
Strongly suspect anxiety related  Disc this in detail ESR today and update

## 2013-01-03 NOTE — Assessment & Plan Note (Signed)
Some stressors and loss lately- suspect pt is developing panic disorder and disturbed sleep Disc coping tech in detail  Given short amt of xanax to use at night with warnings Offered counseling If not imp may benefit from SSRI Will f/u with her pcp if not imp

## 2013-01-03 NOTE — Assessment & Plan Note (Signed)
Suspect tension headache -but in light of temporal tenderness checking ESR Disc use of heat on neck to relax muscles Disc sleep/ caffeine and water intake  If not imp - in light of age may consider imaging Will update

## 2013-01-04 ENCOUNTER — Encounter: Payer: Self-pay | Admitting: *Deleted

## 2013-02-04 ENCOUNTER — Other Ambulatory Visit: Payer: Self-pay | Admitting: Family Medicine

## 2013-03-12 ENCOUNTER — Other Ambulatory Visit: Payer: Self-pay | Admitting: Family Medicine

## 2013-04-10 ENCOUNTER — Other Ambulatory Visit: Payer: Self-pay | Admitting: Family Medicine

## 2013-04-12 ENCOUNTER — Other Ambulatory Visit: Payer: Self-pay

## 2013-04-12 DIAGNOSIS — Z1231 Encounter for screening mammogram for malignant neoplasm of breast: Secondary | ICD-10-CM

## 2013-04-30 ENCOUNTER — Ambulatory Visit: Payer: BC Managed Care – PPO

## 2013-05-03 ENCOUNTER — Ambulatory Visit
Admission: RE | Admit: 2013-05-03 | Discharge: 2013-05-03 | Disposition: A | Discharge status: Home / Self Care | Payer: BC Managed Care – PPO | Source: Ambulatory Visit

## 2013-05-03 DIAGNOSIS — Z1231 Encounter for screening mammogram for malignant neoplasm of breast: Secondary | ICD-10-CM

## 2013-07-09 ENCOUNTER — Other Ambulatory Visit: Payer: Self-pay | Admitting: Family Medicine

## 2013-07-09 NOTE — Telephone Encounter (Signed)
Last office visit with Dr. Ermalene Searing 07/27/2012.  Has CPX scheduled for 08/16/2013. Not on medication list. Ok to refill?

## 2013-07-10 NOTE — Telephone Encounter (Signed)
Patient has been going to Dr. Nicholas Lose but he is now retired.  Is trying to get established with another OB-GYN but has not gotten an appointment yet and she is out of her patches.    Ota states if she doesn't get in with GYN before her physical with Dr. Ermalene Searing, she will do everything with Dr. Ermalene Searing.  Will refill until her CPX with Dr. Ermalene Searing in December.

## 2013-07-10 NOTE — Telephone Encounter (Signed)
I believe this was prescribed by GYN in past... Does she plan on coming here for gyn issues at CPX? If not, refill needs to come from GYN

## 2013-07-11 NOTE — Telephone Encounter (Signed)
Agreed -

## 2013-07-12 ENCOUNTER — Telehealth: Payer: Self-pay | Admitting: Family Medicine

## 2013-07-12 DIAGNOSIS — E7800 Pure hypercholesterolemia, unspecified: Secondary | ICD-10-CM

## 2013-07-12 DIAGNOSIS — R7303 Prediabetes: Secondary | ICD-10-CM

## 2013-07-12 NOTE — Telephone Encounter (Signed)
Message copied by Excell Seltzer on Fri Jul 12, 2013  8:08 AM ------      Message from: Alvina Chou      Created: Tue Jul 09, 2013 11:01 AM      Regarding: Lab orders for Monday, 11.10.14       Patient is scheduled for CPX labs, please order future labs, Thanks , Terri       ------

## 2013-07-15 ENCOUNTER — Other Ambulatory Visit (INDEPENDENT_AMBULATORY_CARE_PROVIDER_SITE_OTHER): Payer: BC Managed Care – PPO

## 2013-07-15 DIAGNOSIS — R7303 Prediabetes: Secondary | ICD-10-CM

## 2013-07-15 DIAGNOSIS — R7309 Other abnormal glucose: Secondary | ICD-10-CM

## 2013-07-15 DIAGNOSIS — E7800 Pure hypercholesterolemia, unspecified: Secondary | ICD-10-CM

## 2013-07-15 LAB — COMPREHENSIVE METABOLIC PANEL
ALT: 24 U/L (ref 0–35)
CO2: 29 mEq/L (ref 19–32)
Calcium: 9.1 mg/dL (ref 8.4–10.5)
Chloride: 107 mEq/L (ref 96–112)
GFR: 81.54 mL/min (ref >60.00)
Glucose, Bld: 99 mg/dL (ref 70–99)
Sodium: 140 mEq/L (ref 135–145)
Total Bilirubin: 0.5 mg/dL (ref 0.3–1.2)
Total Protein: 7 g/dL (ref 6.0–8.3)

## 2013-07-15 LAB — LIPID PANEL
HDL: 65.4 mg/dL (ref >39.00)
Total CHOL/HDL Ratio: 2
Triglycerides: 71 mg/dL (ref 0.0–149.0)

## 2013-07-30 ENCOUNTER — Encounter: Payer: BC Managed Care – PPO | Admitting: Family Medicine

## 2013-08-16 ENCOUNTER — Ambulatory Visit (INDEPENDENT_AMBULATORY_CARE_PROVIDER_SITE_OTHER): Payer: BC Managed Care – PPO | Admitting: Family Medicine

## 2013-08-16 ENCOUNTER — Encounter: Payer: Self-pay | Admitting: Family Medicine

## 2013-08-16 VITALS — BP 150/80 | HR 78 | Temp 98.2°F | Ht 63.5 in | Wt 184.5 lb

## 2013-08-16 DIAGNOSIS — E7800 Pure hypercholesterolemia, unspecified: Secondary | ICD-10-CM

## 2013-08-16 DIAGNOSIS — R030 Elevated blood-pressure reading, without diagnosis of hypertension: Secondary | ICD-10-CM

## 2013-08-16 DIAGNOSIS — N951 Menopausal and female climacteric states: Secondary | ICD-10-CM

## 2013-08-16 DIAGNOSIS — R7309 Other abnormal glucose: Secondary | ICD-10-CM

## 2013-08-16 DIAGNOSIS — J309 Allergic rhinitis, unspecified: Secondary | ICD-10-CM

## 2013-08-16 DIAGNOSIS — R7303 Prediabetes: Secondary | ICD-10-CM

## 2013-08-16 DIAGNOSIS — Z0000 Encounter for general adult medical examination without abnormal findings: Secondary | ICD-10-CM

## 2013-08-16 DIAGNOSIS — J302 Other seasonal allergic rhinitis: Secondary | ICD-10-CM

## 2013-08-16 MED ORDER — ESTRADIOL 0.0375 MG/24HR TD PTTW: 1.0000 | MEDICATED_PATCH | TRANSDERMAL | Status: DC

## 2013-08-16 NOTE — Assessment & Plan Note (Signed)
Follow at home.. She may have white coat HTN.

## 2013-08-16 NOTE — Progress Notes (Signed)
Pre-visit discussion using our clinic review tool. No additional management support is needed unless otherwise documented below in the visit note.  

## 2013-08-16 NOTE — Assessment & Plan Note (Signed)
Conitnue nasal saline spray, start mucinex, nasal saline and claritin.

## 2013-08-16 NOTE — Patient Instructions (Addendum)
Follow BP at home/pharm check every few days, call in 1-2 weeks with the measurement. Goal < 140/90. Change to the 0.0375 patches, then after a few months,can try to decrease to weekly then every other week then. Work on [employer] , regular exercise and weight loss. Can use mucinex and nasal saline irrigation for sinus pressure.  If still with allergy symptoms add claritin during the day in addition to regular benadryl at night.

## 2013-08-16 NOTE — Assessment & Plan Note (Signed)
Well controlled .Marland Kitchen She will try to wean off patches very slowly in next year.

## 2013-08-16 NOTE — Progress Notes (Signed)
HPI  52 year old presents for yearly follow up.   Last saw GYN for yearly CPX. .. 06/2012   She has been unable to make an appt there.  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  Other complaints:   Prediabetes: resolved.   BP elevated today. BP Readings from Last 3 Encounters:  08/16/13 150/80  01/02/13 120/80  07/27/12 130/92    Walks a lot at work and home.   Moderate diet. Wt Readings from Last 3 Encounters:  08/16/13 184 lb 8 oz (83.689 kg)  01/02/13 181 lb (82.101 kg)  07/27/12 181 lb 8 oz (82.328 kg)     Review of Systems  Constitutional: Negative for fever, fatigue and unexpected weight change.  Occ cramping, feet. HENT: Negative for ear pain, congestion, sore throat, sneezing, trouble swallowing and sinus pressure.  Eyes: Negative for pain and itching.  Respiratory: Negative for cough, shortness of breath and wheezing.  Cardiovascular: Negative for chest pain, palpitations and leg swelling.  Gastrointestinal: Negative for nausea, abdominal pain, diarrhea, constipation and blood in stool.  Genitourinary: Negative for dysuria, hematuria, vaginal discharge, difficulty urinating and menstrual problem.  Skin: Negative for rash.  Neurological: Negative for syncope, weakness, light-headedness, numbness and headaches.  Psychiatric/Behavioral: Negative for confusion and dysphoric mood. The patient is not nervous/anxious.  Objective:   Physical Exam  Constitutional: Vital signs are normal. She appears well-developed and well-nourished. She is cooperative. Non-toxic appearance. She does not  appear ill. No distress.  HENT:  Head: Normocephalic.  Right Ear: Hearing, tympanic membrane, external ear and ear canal normal.  Left Ear: Hearing, tympanic membrane, external ear and ear canal normal.  Nose: Nose normal.  Eyes: Conjunctivae normal, EOM and lids are normal. Pupils are equal, round, and reactive to light. No foreign bodies found.  Neck: Trachea normal and normal range of motion. Neck supple. Carotid bruit is not present. No mass and no thyromegaly present.  Cardiovascular: Normal rate, regular rhythm, S1 normal, S2 normal, normal heart sounds and intact distal pulses. Exam reveals no gallop.  No murmur heard.  Pulmonary/Chest: Effort normal and breath sounds normal. No respiratory distress. She has no wheezes. She has no rhonchi. She has no rales.  Abdominal: Soft. Normal appearance and bowel sounds are normal. She exhibits no distension, no fluid wave, no abdominal bruit and no mass. There is no hepatosplenomegaly. There is no tenderness. There is no rebound, no guarding and no CVA tenderness. No hernia.  Musculoskeletal:  Lumbar back: She exhibits decreased range of motion and tenderness. She exhibits no bony tenderness and no swelling.  Neg faber's, neg SLR. Lymphadenopathy:  She has no cervical adenopathy.  She has no axillary adenopathy.  Neurological: She is alert. She has normal strength. No cranial nerve deficit or sensory deficit.  Skin: Skin is warm, dry and intact. No rash noted.  Psychiatric: Her speech is normal and behavior is normal. Judgment normal. Her mood appears not anxious. Cognition and memory are normal. She does not exhibit a depressed mood.  GYN:  Normal introitus for age, no external lesions, no vaginal discharge, mucosa pink and moist,  no vaginal or cervical lesions, no vaginal atrophy, no friaility or hemorrhage, normal uterus size and position, no adnexal masses or tenderness.  Chaperoned exam.  Assessment & Plan:   The patient's preventative  maintenance and recommended screening tests for an annual wellness exam were reviewed in full today.  Brought up to date unless services declined.  Counselled on the importance of diet, exercise, and its role in overall health and mortality.  The patient's FH and SH was reviewed, including their home life, tobacco status, and drug and alcohol status.   Vaccines: Got Flu at work, last Tdap  2013.  Former smoker >25 pack year history. Asymptomatic, nml spirometry, last CXR 2013 nml.Marland Kitchen  PAP/DVE:last nml pap 06/2012, pap not needed, DVE year. Mammo nml 04/2013 Colon: nml 2011, recommnded repeat in 10 years, Dr. Russella Dar.

## 2013-08-16 NOTE — Assessment & Plan Note (Signed)
Resolved

## 2013-08-16 NOTE — Assessment & Plan Note (Signed)
Well controlled. Continue current medication.  

## 2013-09-01 ENCOUNTER — Other Ambulatory Visit: Payer: Self-pay | Admitting: Family Medicine

## 2013-09-10 ENCOUNTER — Telehealth: Payer: Self-pay

## 2013-09-10 NOTE — Telephone Encounter (Signed)
Pt left v/m wanting to update Dr Diona Browner on BP. Left v/m for pt to cb.

## 2013-09-18 NOTE — Telephone Encounter (Signed)
Left v/m requesting cb.

## 2013-09-20 MED ORDER — SIMVASTATIN 40 MG PO TABS: ORAL_TABLET | ORAL | Status: DC

## 2013-09-20 MED ORDER — FLUTICASONE PROPIONATE 50 MCG/ACT NA SUSP: NASAL | Status: DC

## 2013-09-20 NOTE — Telephone Encounter (Signed)
Noted  

## 2013-09-20 NOTE — Telephone Encounter (Signed)
Bp improved from OV, continue to follow.

## 2013-09-20 NOTE — Telephone Encounter (Signed)
This week BP averages 142/84. On 09/18/13 BP was 138/72. No h/a, dizziness, CP or SOB. Pt states she feels good. Pt also request refill flonase and simvastatin to [facility];refills done.

## 2014-03-04 ENCOUNTER — Other Ambulatory Visit: Payer: Self-pay | Admitting: Family Medicine

## 2014-04-06 ENCOUNTER — Other Ambulatory Visit: Payer: Self-pay | Admitting: Family Medicine

## 2014-04-08 ENCOUNTER — Telehealth: Payer: Self-pay | Admitting: Family Medicine

## 2014-04-08 NOTE — Telephone Encounter (Signed)
Left message for Veronica Hawkins that her Simvastatin was sent in to [facility] on 04/06/2014.  Advised to call them to find out the status of her prescription.

## 2014-04-08 NOTE — Telephone Encounter (Signed)
Pt is completely out of her cholesterol medicine (simvistatin)  She needs a refill faxed to [facility] mail order.  Please call pt at home or on cell

## 2014-05-09 ENCOUNTER — Other Ambulatory Visit: Payer: Self-pay

## 2014-05-09 DIAGNOSIS — Z1231 Encounter for screening mammogram for malignant neoplasm of breast: Secondary | ICD-10-CM

## 2014-05-20 ENCOUNTER — Ambulatory Visit
Admission: RE | Admit: 2014-05-20 | Discharge: 2014-05-20 | Disposition: A | Discharge status: Home / Self Care | Payer: BC Managed Care – PPO | Source: Ambulatory Visit

## 2014-05-20 DIAGNOSIS — Z1231 Encounter for screening mammogram for malignant neoplasm of breast: Secondary | ICD-10-CM

## 2014-07-14 ENCOUNTER — Other Ambulatory Visit: Payer: Self-pay

## 2014-07-14 MED ORDER — SIMVASTATIN 40 MG PO TABS: ORAL_TABLET | ORAL | Status: DC

## 2014-07-14 NOTE — Telephone Encounter (Signed)
Pt left v/m requesting refill simvastatin to [facility]; pt scheduled for CPX 08/19/14. Advised done on pts confidential v/m.

## 2014-08-03 ENCOUNTER — Telehealth: Payer: Self-pay | Admitting: Family Medicine

## 2014-08-03 DIAGNOSIS — R7303 Prediabetes: Secondary | ICD-10-CM

## 2014-08-03 DIAGNOSIS — E7800 Pure hypercholesterolemia, unspecified: Secondary | ICD-10-CM

## 2014-08-03 NOTE — Telephone Encounter (Signed)
-----   Message from Ellamae Sia sent at 07/29/2014 12:54 PM EST ----- Regarding: Lab orders for Monday, 11.30.15 Patient is scheduled for CPX labs, please order future labs, Thanks , Karna Christmas

## 2014-08-04 ENCOUNTER — Other Ambulatory Visit (INDEPENDENT_AMBULATORY_CARE_PROVIDER_SITE_OTHER): Payer: BC Managed Care – PPO

## 2014-08-04 DIAGNOSIS — E7800 Pure hypercholesterolemia, unspecified: Secondary | ICD-10-CM

## 2014-08-04 DIAGNOSIS — R7309 Other abnormal glucose: Secondary | ICD-10-CM

## 2014-08-04 DIAGNOSIS — R7303 Prediabetes: Secondary | ICD-10-CM

## 2014-08-04 LAB — COMPREHENSIVE METABOLIC PANEL
ALBUMIN: 4 g/dL (ref 3.5–5.2)
ALK PHOS: 41 U/L (ref 39–117)
ALT: 20 U/L (ref 0–35)
AST: 20 U/L (ref 0–37)
BILIRUBIN TOTAL: 0.7 mg/dL (ref 0.2–1.2)
BUN: 19 mg/dL (ref 6–23)
CO2: 26 mEq/L (ref 19–32)
Calcium: 9.1 mg/dL (ref 8.4–10.5)
Chloride: 106 mEq/L (ref 96–112)
Creatinine, Ser: 0.8 mg/dL (ref 0.4–1.2)
GFR: 84.99 mL/min (ref >60.00)
Glucose, Bld: 95 mg/dL (ref 70–99)
POTASSIUM: 4.3 meq/L (ref 3.5–5.1)
SODIUM: 138 meq/L (ref 135–145)
TOTAL PROTEIN: 7 g/dL (ref 6.0–8.3)

## 2014-08-04 LAB — LIPID PANEL
Cholesterol: 154 mg/dL (ref 0–200)
HDL: 54.2 mg/dL (ref >39.00)
LDL CALC: 85 mg/dL (ref 0–99)
NONHDL: 99.8
Total CHOL/HDL Ratio: 3
Triglycerides: 76 mg/dL (ref 0.0–149.0)
VLDL: 15.2 mg/dL (ref 0.0–40.0)

## 2014-08-04 LAB — HEMOGLOBIN A1C: Hgb A1c MFr Bld: 6 % (ref 4.6–6.5)

## 2014-08-19 ENCOUNTER — Encounter: Payer: Self-pay | Admitting: Family Medicine

## 2014-08-19 ENCOUNTER — Ambulatory Visit (INDEPENDENT_AMBULATORY_CARE_PROVIDER_SITE_OTHER): Payer: BC Managed Care – PPO | Admitting: Family Medicine

## 2014-08-19 VITALS — BP 120/64 | HR 78 | Temp 98.3°F | Ht 63.5 in | Wt 194.5 lb

## 2014-08-19 DIAGNOSIS — R030 Elevated blood-pressure reading, without diagnosis of hypertension: Secondary | ICD-10-CM

## 2014-08-19 DIAGNOSIS — E7800 Pure hypercholesterolemia, unspecified: Secondary | ICD-10-CM

## 2014-08-19 DIAGNOSIS — Z0000 Encounter for general adult medical examination without abnormal findings: Secondary | ICD-10-CM

## 2014-08-19 DIAGNOSIS — N8110 Cystocele, unspecified: Secondary | ICD-10-CM

## 2014-08-19 DIAGNOSIS — IMO0002 Reserved for concepts with insufficient information to code with codable children: Secondary | ICD-10-CM | POA: Insufficient documentation

## 2014-08-19 MED ORDER — MOMETASONE FUROATE 50 MCG/ACT NA SUSP: 2.0000 | Freq: Every day | NASAL | Status: DC

## 2014-08-19 NOTE — Assessment & Plan Note (Signed)
Well controlled. Continue current medication.  

## 2014-08-19 NOTE — Assessment & Plan Note (Signed)
Stable

## 2014-08-19 NOTE — Progress Notes (Signed)
Pre visit review using our clinic review tool, if applicable. No additional management support is needed unless otherwise documented below in the visit note. 

## 2014-08-19 NOTE — Patient Instructions (Signed)
Work on [employer] and regular exercise.  Can use topical OTc cortisone on elbow rash.  Change flonase to nasonex.

## 2014-08-19 NOTE — Progress Notes (Signed)
53 year old presents for yearly follow up.   She has noted in last month that she has a ball/ tissue in vaginal opening. No irritation, no bleeding. No dysuria. No pressure sensation.  She is s/p total hysterectomy.  She has had some stress inlast year, husband with colon cancer with lesion on liver... Resolved now. Father died in last year.  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  Other complaints:   Prediabetes: improved.  Lab Results  Component Value Date   HGBA1C 6.0 08/04/2014     BP at goal, not following at home.  BP Readings from Last 3 Encounters:  08/19/14 120/64  08/16/13 150/80  01/02/13 120/80   She has no been exercising much in last year.  Moderate diet. Planning low carb diet. Wt Readings from Last 3 Encounters:  08/19/14 194 lb 8 oz (88.225 kg)  08/16/13 184 lb 8 oz (83.689 kg)  01/02/13 181 lb (82.101 kg)    She has been able to wean off estrogen, last patch in 04/2014. She is doing well. Mood is stable.  Review of Systems  Constitutional: Negative for fever, fatigue and unexpected weight change. Occ cramping, feet. HENT: Negative for ear pain, congestion, sore throat, sneezing, trouble swallowing and sinus pressure.  Eyes: Negative for pain and itching.  Respiratory: Negative for cough, shortness of breath and wheezing.  Cardiovascular: Negative for chest pain, palpitations and leg swelling.  Gastrointestinal: Negative for nausea, abdominal pain, diarrhea, constipation and blood in stool.  Genitourinary: Negative for dysuria, hematuria, vaginal discharge, difficulty urinating and menstrual problem.  Skin:  Negative for rash.  Neurological: Negative for syncope, weakness, light-headedness, numbness and headaches.  Psychiatric/Behavioral: Negative for confusion and dysphoric mood. The patient is not nervous/anxious.  Objective:   Physical Exam  Constitutional: Vital signs are normal. She appears well-developed and well-nourished. She is cooperative. Non-toxic appearance. She does not appear ill. No distress.  HENT:  Head: Normocephalic.  Right Ear: Hearing, tympanic membrane, external ear and ear canal normal.  Left Ear: Hearing, tympanic membrane, external ear and ear canal normal.  Nose: Nose normal.  Eyes: Conjunctivae normal, EOM and lids are normal. Pupils are equal, round, and reactive to light. No foreign bodies found.  Neck: Trachea normal and normal range of motion. Neck supple. Carotid bruit is not present. No mass and no thyromegaly present.  Cardiovascular: Normal rate, regular rhythm, S1 normal, S2 normal, normal heart sounds and intact distal pulses. Exam reveals no gallop.  No murmur heard.  Pulmonary/Chest: Effort normal and breath sounds normal. No respiratory distress. She has no wheezes. She has no rhonchi. She has no rales.  Abdominal: Soft. Normal appearance and bowel sounds are normal. She exhibits no distension, no fluid wave, no abdominal bruit and no mass. There is no hepatosplenomegaly. There is no tenderness. There is no rebound, no guarding and no CVA tenderness. No hernia.  Musculoskeletal:  Lumbar back: She exhibits decreased range of motion and tenderness. She exhibits no bony tenderness and no swelling.  Neg faber's, neg SLR. Lymphadenopathy:  She has no cervical adenopathy.  She has no axillary adenopathy.  Neurological: She is alert. She has normal strength. No  cranial nerve deficit or sensory deficit.  Skin: Skin is warm, dry and intact. No rash noted. Dry skin on elbows ( will try topical steroid OTC.)  Psychiatric: Her speech is normal and  behavior is normal. Judgment normal. Her mood appears not anxious. Cognition and memory are normal. She does not exhibit a depressed mood.  GYN: Normal introitus for age, no external lesions, no vaginal discharge, mucosa pink and moist, no vaginal or cervical lesions, no vaginal atrophy, no friaility or hemorrhage, GRODAE 1 cystocele, no adnexal masses or tenderness. Chaperoned exam.  Assessment & Plan:   The patient's preventative maintenance and recommended screening tests for an annual wellness exam were reviewed in full today.  Brought up to date unless services declined.  Counselled on the importance of diet, exercise, and its role in overall health and mortality.  The patient's FH and SH was reviewed, including their home life, tobacco status, and drug and alcohol status.   Vaccines: Got Flu at work, last Tdap 2013.  Former smoker >25 pack year history. Asymptomatic, nml spirometry, last CXR 2013 nml.Marland Kitchen  PAP/DVE:last nml pap 06/2012, pap not needed, DVE year. Mammo nml 05/2014 Colon: nml 2011, recommnded repeat in 10 years, Dr. Fuller Plan.

## 2014-08-19 NOTE — Assessment & Plan Note (Signed)
Asymptomatic. 

## 2014-09-19 ENCOUNTER — Ambulatory Visit (INDEPENDENT_AMBULATORY_CARE_PROVIDER_SITE_OTHER): Payer: BLUE CROSS/BLUE SHIELD | Admitting: Family Medicine

## 2014-09-19 ENCOUNTER — Encounter: Payer: Self-pay | Admitting: Family Medicine

## 2014-09-19 VITALS — BP 118/66 | HR 93 | Temp 101.4°F | Ht 63.5 in | Wt 194.0 lb

## 2014-09-19 DIAGNOSIS — J020 Streptococcal pharyngitis: Secondary | ICD-10-CM

## 2014-09-19 DIAGNOSIS — J029 Acute pharyngitis, unspecified: Secondary | ICD-10-CM

## 2014-09-19 LAB — POCT RAPID STREP A (OFFICE): RAPID STREP A SCREEN: POSITIVE — AB

## 2014-09-19 MED ORDER — AMOXICILLIN 500 MG PO CAPS: 1000.0000 mg | ORAL_CAPSULE | Freq: Two times a day (BID) | ORAL | Status: DC

## 2014-09-19 MED ORDER — SIMVASTATIN 40 MG PO TABS: ORAL_TABLET | ORAL | Status: DC

## 2014-09-19 NOTE — Assessment & Plan Note (Signed)
Treat with antibiotics and antipyretic. Disucssed comps to look out for, Info given.

## 2014-09-19 NOTE — Patient Instructions (Addendum)
Complete antibiotics. Ibuprofen 800 mg every eight hours for fever and sore throat pain. Call if not improving or if continued fever after 48 hours of antibiotics.  Push fluids, rest.  Strep Throat Strep throat is an infection of the throat caused by a bacteria named Streptococcus pyogenes. Your health care provider may call the infection streptococcal "tonsillitis" or "pharyngitis" depending on whether there are signs of inflammation in the tonsils or back of the throat. Strep throat is most common in children aged 5-15 years during the cold months of the year, but it can occur in people of any age during any season. This infection is spread from person to person (contagious) through coughing, sneezing, or other close contact. SIGNS AND SYMPTOMS   Fever or chills.  Painful, swollen, red tonsils or throat.  Pain or difficulty when swallowing.  White or yellow spots on the tonsils or throat.  Swollen, tender lymph nodes or "glands" of the neck or under the jaw.  Red rash all over the body (rare). DIAGNOSIS  Many different infections can cause the same symptoms. A test must be done to confirm the diagnosis so the right treatment can be given. A "rapid strep test" can help your health care provider make the diagnosis in a few minutes. If this test is not available, a light swab of the infected area can be used for a throat culture test. If a throat culture test is done, results are usually available in a day or two. TREATMENT  Strep throat is treated with antibiotic medicine. HOME CARE INSTRUCTIONS   Gargle with 1 tsp of salt in 1 cup of warm water, 3-4 times per day or as needed for comfort.  Family members who also have a sore throat or fever should be tested for strep throat and treated with antibiotics if they have the strep infection.  Make sure everyone in your household washes their hands well.  Do not share food, drinking cups, or personal items that could cause the infection to  spread to others.  You may need to eat a soft food diet until your sore throat gets better.  Drink enough water and fluids to keep your urine clear or pale yellow. This will help prevent dehydration.  Get plenty of rest.  Stay home from school, day care, or work until you have been on antibiotics for 24 hours.  Take medicines only as directed by your health care provider.  Take your antibiotic medicine as directed by your health care provider. Finish it even if you start to feel better. SEEK MEDICAL CARE IF:   The glands in your neck continue to enlarge.  You develop a rash, cough, or earache.  You cough up green, yellow-brown, or bloody sputum.  You have pain or discomfort not controlled by medicines.  Your problems seem to be getting worse rather than better.  You have a fever. SEEK IMMEDIATE MEDICAL CARE IF:   You develop any new symptoms such as vomiting, severe headache, stiff or painful neck, chest pain, shortness of breath, or trouble swallowing.  You develop severe throat pain, drooling, or changes in your voice.  You develop swelling of the neck, or the skin on the neck becomes red and tender.  You develop signs of dehydration, such as fatigue, dry mouth, and decreased urination.  You become increasingly sleepy, or you cannot wake up completely. MAKE SURE YOU:  Understand these instructions.  Will watch your condition.  Will get help right away if you are not  doing well or get worse. Document Released: 08/19/2000 Document Revised: 01/06/2014 Document Reviewed: 10/21/2010 [facility] Patient Information 2015 [place], [place]. This information is not intended to replace advice given to you by your health care provider. Make sure you discuss any questions you have with your health care provider.

## 2014-09-19 NOTE — Progress Notes (Signed)
   Subjective:    Patient ID: Veronica Hawkins, female    DOB: [birth year], 54 y.o.   MRN: [id number]  Sore Throat  This is a new problem. The current episode started in the past 7 days (3 days). The problem has been gradually worsening. Neither side of throat is experiencing more pain than the other. The maximum temperature recorded prior to her arrival was 101 - 101.9 F. The pain is severe. Associated symptoms include ear pain, swollen glands and trouble swallowing. Pertinent negatives include no shortness of breath. Associated symptoms comments: Body ache. She has had no exposure to strep or mono. She has tried NSAIDs and acetaminophen (nyquil, sinus med) for the symptoms. The treatment provided mild relief.      Review of Systems  Constitutional: Positive for fever and fatigue.  HENT: Positive for ear pain and trouble swallowing.   Eyes: Negative for pain.  Respiratory: Negative for shortness of breath.        Objective:   Physical Exam  Constitutional: Vital signs are normal. She appears well-developed and well-nourished. She is cooperative.  Non-toxic appearance. She does not appear ill. No distress.  HENT:  Head: Normocephalic.  Right Ear: Hearing, tympanic membrane, external ear and ear canal normal. Tympanic membrane is not erythematous, not retracted and not bulging.  Left Ear: Hearing, tympanic membrane, external ear and ear canal normal. Tympanic membrane is not erythematous, not retracted and not bulging.  Nose: No mucosal edema or rhinorrhea. Right sinus exhibits no maxillary sinus tenderness and no frontal sinus tenderness. Left sinus exhibits no maxillary sinus tenderness and no frontal sinus tenderness.  Mouth/Throat: Uvula is midline and mucous membranes are normal. Oropharyngeal exudate, posterior oropharyngeal edema and posterior oropharyngeal erythema present. No tonsillar abscesses.  Eyes: Conjunctivae, EOM and lids are normal. Pupils are equal, round, and reactive to  light. Lids are everted and swept, no foreign bodies found.  Neck: Trachea normal and normal range of motion. Neck supple. Carotid bruit is not present. No thyroid mass and no thyromegaly present.  Cardiovascular: Normal rate, regular rhythm, S1 normal, S2 normal, normal heart sounds, intact distal pulses and normal pulses.  Exam reveals no gallop and no friction rub.   No murmur heard. Pulmonary/Chest: Effort normal and breath sounds normal. No tachypnea. No respiratory distress. She has no decreased breath sounds. She has no wheezes. She has no rhonchi. She has no rales.  Abdominal: Soft. Normal appearance and bowel sounds are normal. There is no tenderness.  Lymphadenopathy:    She has cervical adenopathy.  Neurological: She is alert.  Skin: Skin is warm, dry and intact. No rash noted.  Psychiatric: Her speech is normal and behavior is normal. Judgment and thought content normal. Her mood appears not anxious. Cognition and memory are normal. She does not exhibit a depressed mood.          Assessment & Plan:

## 2014-09-19 NOTE — Progress Notes (Signed)
Pre visit review using our clinic review tool, if applicable. No additional management support is needed unless otherwise documented below in the visit note. 

## 2014-11-29 ENCOUNTER — Encounter: Payer: Self-pay | Admitting: Family Medicine

## 2014-11-29 ENCOUNTER — Ambulatory Visit (INDEPENDENT_AMBULATORY_CARE_PROVIDER_SITE_OTHER): Payer: BLUE CROSS/BLUE SHIELD | Admitting: Family Medicine

## 2014-11-29 VITALS — BP 134/78 | HR 77 | Temp 98.1°F | Wt 198.0 lb

## 2014-11-29 DIAGNOSIS — R300 Dysuria: Secondary | ICD-10-CM

## 2014-11-29 DIAGNOSIS — N3001 Acute cystitis with hematuria: Secondary | ICD-10-CM | POA: Diagnosis not present

## 2014-11-29 LAB — POCT URINALYSIS DIPSTICK
BILIRUBIN UA: NEGATIVE
GLUCOSE UA: NEGATIVE
KETONES UA: NEGATIVE
NITRITE UA: NEGATIVE
PH UA: 7
Protein, UA: NEGATIVE
Spec Grav, UA: <=1.005
Urobilinogen, UA: NEGATIVE

## 2014-11-29 MED ORDER — CIPROFLOXACIN HCL 500 MG PO TABS: 500.0000 mg | ORAL_TABLET | Freq: Two times a day (BID) | ORAL | Status: DC

## 2014-11-29 NOTE — Addendum Note (Signed)
Addended by: Lurlean Nanny on: 11/29/2014 10:53 AM   Modules accepted: Orders

## 2014-11-29 NOTE — Progress Notes (Signed)
Pre visit review using our clinic review tool, if applicable. No additional management support is needed unless otherwise documented below in the visit note. 

## 2014-11-29 NOTE — Progress Notes (Signed)
SUBJECTIVE: Veronica Hawkins is a 54 y.o. female pt of Dr. Diona Browner, new to me,  who presents to [facility] with complaints of urinary frequency, urgency and dysuria x 3 or hours hours of dysuria, without flank pain, fever, chills, or abnormal vaginal discharge or bleeding.   Does not have a h/o recurrent UTIs.    Current Outpatient Prescriptions on File Prior to Visit  Medication Sig Dispense Refill  . LevOCARNitine Fumarate (L-CARNITINE) 200 MG CAPS Take 1 capsule by mouth daily.      . meloxicam (MOBIC) 15 MG tablet Take 15 mg by mouth daily.    . simvastatin (ZOCOR) 40 MG tablet TAKE 1 TABLET DAILY 90 tablet 3   No current facility-administered medications on file prior to visit.    No Known Allergies  Past Medical History  Diagnosis Date  . Allergy     seasonal  . Arthritis   . Hyperlipidemia     Past Surgical History  Procedure Laterality Date  . Total hip arthroplasty  [id number]    Right hip  . Hip surgery      right to remove bands  . Shoulder arthroscopy      left ligaments and tendon repair and spurs  . Abdominal hysterectomy  1986  . Colonoscopy      History reviewed. No pertinent family history.  History   Social History  . Marital Status: Married    Spouse Name: N/A  . Number of Children: N/A  . Years of Education: N/A   Occupational History  . Not on file.   Social History Main Topics  . Smoking status: Former Smoker    Types: Cigarettes    Quit date: 03/16/2010  . Smokeless tobacco: Never Used  . Alcohol Use: No  . Drug Use: No  . Sexual Activity: Not on file   Other Topics Concern  . Not on file   Social History Narrative   The PMH, PSH, Social History, Family History, Medications, and allergies have been reviewed in [facility], and have been updated if relevant.  OBJECTIVE: BP 134/78 mmHg  Pulse 77  Temp(Src) 98.1 F (36.7 C) (Oral)  Wt 198 lb (89.812 kg)  SpO2 98%   Appears well, in no apparent distress.  Vital signs are  normal. The abdomen is soft without tenderness, guarding, mass, rebound or organomegaly. No CVA tenderness or inguinal adenopathy noted. Urine dipstick shows positive for WBC's and positive for RBC's.  Micro exam: not done.   ASSESSMENT: UTI uncomplicated without evidence of pyelonephritis  PLAN: Treatment per orders - cipro 500 mg twice daily x 3 days, send urine for cx.  Also push fluids, may use Pyridium OTC prn. Call or return to clinic prn if these symptoms worsen or fail to improve as anticipated.

## 2014-11-29 NOTE — Patient Instructions (Signed)
Nice to meet you. Please take cipro as directed - 1 tablet twice daily x 3 days.  Ask for AZO at the pharmacy to help with burning.  Follow up with your doctor next week if you're still having symptoms.  Have a great Easter!

## 2014-12-01 ENCOUNTER — Telehealth: Payer: Self-pay

## 2014-12-01 NOTE — Telephone Encounter (Signed)
Pt was seen at [facility] on 11/29/14.

## 2014-12-01 NOTE — Telephone Encounter (Signed)
PLEASE NOTE: All timestamps contained within this report are represented as [place] Standard Time. CONFIDENTIALTY NOTICE: This fax transmission is intended only for the addressee. It contains information that is legally privileged, confidential or otherwise protected from use or disclosure. If you are not the intended recipient, you are strictly prohibited from reviewing, disclosing, copying using or disseminating any of this information or taking any action in reliance on or regarding this information. If you have received this fax in error, please notify us immediately by telephone so that we can arrange for its return to [place]. Phone: [phone], Toll-Free: [phone], Fax: [phone] Page: 1 of 2 Call Id: [id number] [place] Patient Name: Veronica Hawkins Gender: Female DOB: 29-Aug-1961 Age: 54 Y 2 M 25 D Return Phone Number: [phone] (Primary) Address: City/State/Zip: [place] [address] Client [facility] Night - Client Client Site [place] Physician Diona Browner, Amy Contact Type Call Call Type Triage / Clinical Relationship To Patient Self Return Phone Number [phone] (Primary) Chief Complaint URINATE - sudden inability to urinate Initial Comment Caller states that she can't urinate and she is having a lot of burning. PreDisposition Did not know what to do Nurse Assessment Nurse: Neil Crouch, RN, Baker Janus Date/Time Eilene Ghazi Time): 11/29/2014 6:39:21 AM Confirm and document reason for call. If symptomatic, describe symptoms. ---Caller states that she can't urinate and she is having a lot of burning. no fever. symptoms started this a.m. Has the patient traveled out of the country within the last 30 days? ---Not Applicable Does the patient require triage? ---Yes Related visit to physician within the last 2 weeks? ---N/A Does the  PT have any chronic conditions? (i.e. diabetes, asthma, etc.) ---Yes List chronic conditions. ---high cholesterol, oa Guidelines Guideline Title Affirmed Question Affirmed Notes Nurse Date/Time Eilene Ghazi Time) Urination Pain - Female Artificial heart valve or artificial joint Lenis Noon 11/29/2014 6:41:28 AM Disp. Time Eilene Ghazi Time) Disposition Final User 11/29/2014 6:36:55 AM Send to Urgent Queue Zannie Kehr 11/29/2014 6:47:44 AM Call Completed Neil Crouch, RN, Baker Janus 11/29/2014 6:46:13 AM See Physician within 4 Hours (or PCP triage) Yes Neil Crouch, RN, Christin Bach Understands: Yes Disagree/Comply: Comply PLEASE NOTE: All timestamps contained within this report are represented as [place] Standard Time. CONFIDENTIALTY NOTICE: This fax transmission is intended only for the addressee. It contains information that is legally privileged, confidential or otherwise protected from use or disclosure. If you are not the intended recipient, you are strictly prohibited from reviewing, disclosing, copying using or disseminating any of this information or taking any action in reliance on or regarding this information. If you have received this fax in error, please notify us immediately by telephone so that we can arrange for its return to [place]. Phone: [phone], Toll-Free: [phone], Fax: [phone] Page: 2 of 2 Call Id: [id number] Care Advice Given Per Guideline SEE PHYSICIAN WITHIN 4 HOURS (or PCP triage): * IF NO PCP TRIAGE: You need to be seen. Go to _______________ (ED/ [facility] or office if it will be open) within the next 3 or 4 hours. Go sooner if you become worse. CARE ADVICE given per Urination Pain - Female (Adult) guideline. After Care Instructions Given Call Event Type User Date / Time Description Comments User: George Ina, RN Date/Time Eilene Ghazi Time): 11/29/2014 6:47:27 AM advised that if she cannot empty her bladder and cannot wait until 0900, she may need to go to the ED  to be catheterized. Referrals  [facility]

## 2015-01-06 ENCOUNTER — Encounter: Payer: Self-pay | Admitting: Gastroenterology

## 2015-01-14 ENCOUNTER — Other Ambulatory Visit: Payer: Self-pay | Admitting: Family Medicine

## 2015-02-16 ENCOUNTER — Other Ambulatory Visit: Payer: Self-pay | Admitting: Pain Medicine

## 2015-05-08 ENCOUNTER — Other Ambulatory Visit: Payer: Self-pay

## 2015-05-08 DIAGNOSIS — Z1231 Encounter for screening mammogram for malignant neoplasm of breast: Secondary | ICD-10-CM

## 2015-05-25 ENCOUNTER — Ambulatory Visit
Admission: RE | Admit: 2015-05-25 | Discharge: 2015-05-25 | Disposition: A | Discharge status: Home / Self Care | Payer: 59 | Source: Ambulatory Visit

## 2015-05-25 DIAGNOSIS — Z1231 Encounter for screening mammogram for malignant neoplasm of breast: Secondary | ICD-10-CM

## 2015-07-13 ENCOUNTER — Other Ambulatory Visit: Payer: Self-pay | Admitting: Family Medicine

## 2015-08-20 ENCOUNTER — Telehealth: Payer: Self-pay | Admitting: Family Medicine

## 2015-08-20 DIAGNOSIS — E7800 Pure hypercholesterolemia, unspecified: Secondary | ICD-10-CM

## 2015-08-20 DIAGNOSIS — Z1159 Encounter for screening for other viral diseases: Secondary | ICD-10-CM

## 2015-08-20 DIAGNOSIS — R7303 Prediabetes: Secondary | ICD-10-CM

## 2015-08-20 NOTE — Telephone Encounter (Signed)
-----   Message from Ellamae Sia sent at 08/12/2015  4:54 PM EST ----- Regarding: lab orders for Friday, 12.16.16 Patient is scheduled for CPX labs, please order future labs, Thanks , Karna Christmas

## 2015-08-21 ENCOUNTER — Other Ambulatory Visit (INDEPENDENT_AMBULATORY_CARE_PROVIDER_SITE_OTHER): Payer: 59

## 2015-08-21 ENCOUNTER — Other Ambulatory Visit: Payer: Self-pay | Admitting: Family Medicine

## 2015-08-21 DIAGNOSIS — E7800 Pure hypercholesterolemia, unspecified: Secondary | ICD-10-CM

## 2015-08-21 DIAGNOSIS — Z1159 Encounter for screening for other viral diseases: Secondary | ICD-10-CM

## 2015-08-21 DIAGNOSIS — R7303 Prediabetes: Secondary | ICD-10-CM

## 2015-08-21 LAB — COMPREHENSIVE METABOLIC PANEL
ALK PHOS: 53 U/L (ref 39–117)
ALT: 21 U/L (ref 0–35)
AST: 20 U/L (ref 0–37)
Albumin: 4.3 g/dL (ref 3.5–5.2)
BILIRUBIN TOTAL: 0.6 mg/dL (ref 0.2–1.2)
BUN: 18 mg/dL (ref 6–23)
CALCIUM: 10.1 mg/dL (ref 8.4–10.5)
CO2: 32 mEq/L (ref 19–32)
CREATININE: 0.83 mg/dL (ref 0.40–1.20)
Chloride: 103 mEq/L (ref 96–112)
GFR: 75.32 mL/min (ref >60.00)
Glucose, Bld: 100 mg/dL — ABNORMAL HIGH (ref 70–99)
Potassium: 4.4 mEq/L (ref 3.5–5.1)
Sodium: 141 mEq/L (ref 135–145)
TOTAL PROTEIN: 7.5 g/dL (ref 6.0–8.3)

## 2015-08-21 LAB — LIPID PANEL
CHOL/HDL RATIO: 3
Cholesterol: 168 mg/dL (ref 0–200)
HDL: 65.3 mg/dL (ref >39.00)
LDL Cholesterol: 85 mg/dL (ref 0–99)
NONHDL: 102.7
TRIGLYCERIDES: 91 mg/dL (ref 0.0–149.0)
VLDL: 18.2 mg/dL (ref 0.0–40.0)

## 2015-08-21 LAB — HEMOGLOBIN A1C: HEMOGLOBIN A1C: 5.8 % (ref 4.6–6.5)

## 2015-08-22 LAB — HEPATITIS C ANTIBODY: HCV AB: NEGATIVE

## 2015-08-25 ENCOUNTER — Encounter: Payer: Self-pay | Admitting: Family Medicine

## 2015-08-25 ENCOUNTER — Ambulatory Visit (INDEPENDENT_AMBULATORY_CARE_PROVIDER_SITE_OTHER): Payer: 59 | Admitting: Family Medicine

## 2015-08-25 VITALS — BP 120/72 | HR 60 | Temp 98.3°F | Ht 63.5 in | Wt 206.2 lb

## 2015-08-25 DIAGNOSIS — R7303 Prediabetes: Secondary | ICD-10-CM | POA: Diagnosis not present

## 2015-08-25 DIAGNOSIS — Z0000 Encounter for general adult medical examination without abnormal findings: Secondary | ICD-10-CM | POA: Diagnosis not present

## 2015-08-25 DIAGNOSIS — E7800 Pure hypercholesterolemia, unspecified: Secondary | ICD-10-CM | POA: Diagnosis not present

## 2015-08-25 MED ORDER — SIMVASTATIN 40 MG PO TABS: 40.0000 mg | ORAL_TABLET | Freq: Every day | ORAL | Status: DC

## 2015-08-25 NOTE — Progress Notes (Signed)
54 year old presents for yearly follow up.    Stressful year, mood pretty good overall. NO SI, NO HI. Poor sleep. She has been using melatonin or Zquil.  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  Lab Results  Component Value Date   HGBA1C 5.8 08/21/2015   BP at goal, not following at home.  BP Readings from Last 3 Encounters:  08/25/15 120/72  11/29/14 134/78  09/19/14 118/66   She has no been exercising much in last year.  Moderate diet. Planning low carb diet. Wt Readings from Last 3 Encounters:  08/25/15 206 lb 4 oz (93.554 kg)  11/29/14 198 lb (89.812 kg)  09/19/14 194 lb (87.998 kg)   She has been able to wean off estrogen, last patch in 04/2014. She is doing well. Mood is stable.  Review of Systems  Constitutional: Negative for fever, fatigue and unexpected weight change. Occ cramping, feet. HENT: Negative for ear pain, congestion, sore throat, sneezing, trouble swallowing and sinus pressure.  Eyes: Negative for pain and itching.  Respiratory: Negative for cough, shortness of breath and wheezing.  Cardiovascular: Negative for chest pain, palpitations and leg swelling.  Gastrointestinal: Negative for nausea, abdominal pain, diarrhea, constipation and blood in stool.  Genitourinary: Negative for dysuria, hematuria, vaginal discharge, difficulty urinating and menstrual problem.  Skin: Negative for rash.  Neurological: Negative for syncope, weakness, light-headedness, numbness and headaches.  Psychiatric/Behavioral: Negative for confusion and dysphoric mood. The  patient is not nervous/anxious.  Objective:   Physical Exam  Constitutional: Vital signs are normal. She appears well-developed and well-nourished. She is cooperative. Non-toxic appearance. She does not appear ill. No distress.  HENT:  Head: Normocephalic.  Right Ear: Hearing, tympanic membrane, external ear and ear canal normal.  Left Ear: Hearing, tympanic membrane, external ear and ear canal normal.  Nose: Nose normal.  Eyes: Conjunctivae normal, EOM and lids are normal. Pupils are equal, round, and reactive to light. No foreign bodies found.  Neck: Trachea normal and normal range of motion. Neck supple. Carotid bruit is not present. No mass and no thyromegaly present.  Cardiovascular: Normal rate, regular rhythm, S1 normal, S2 normal, normal heart sounds and intact distal pulses. Exam reveals no gallop.  No murmur heard.  Pulmonary/Chest: Effort normal and breath sounds normal. No respiratory distress. She has no wheezes. She has no rhonchi. She has no rales.  Abdominal: Soft. Normal appearance and bowel sounds are normal. She exhibits no distension, no fluid wave, no abdominal bruit and no mass. There is no hepatosplenomegaly. There is no tenderness. There is no rebound, no guarding and no CVA tenderness. No hernia.  Musculoskeletal:  Lumbar back: She exhibits no bony tenderness and no swelling. Lymphadenopathy:  She has no cervical adenopathy.  She has no axillary adenopathy.  Neurological: She is alert. She has normal strength. No cranial nerve deficit or sensory deficit.  Skin: Skin is warm, dry and intact. No rash noted. Dry skin on elbows ( will try topical steroid OTC.)  Psychiatric: Her speech is normal and behavior is normal. Judgment normal. Her mood appears not anxious. Cognition and memory are normal.  She does not exhibit a depressed mood.  GYN: Normal introitus for age, no external lesions, no vaginal discharge, mucosa pink and moist, no vaginal or cervical  lesions, no vaginal atrophy, no friaility or hemorrhage, GRODAE 1 cystocele, no adnexal masses or tenderness. Chaperoned exam.  Assessment & Plan:   The patient's preventative maintenance and recommended screening tests for an annual wellness exam were reviewed in full today.  Brought up to date unless services declined.  Counselled on the importance of diet, exercise, and its role in overall health and mortality.  The patient's FH and SH was reviewed, including their home life, tobacco status, and drug and alcohol status.   Vaccines: Got Flu at work, last Tdap 2013.  Former smoker >25 pack year history. Asymptomatic, nml spirometry, last CXR 2013 nml.Marland Kitchen  PAP/DVE:s/p  Partial hysterectomy followed per GYN. Mammo nml 05/2015 Colon: nml 01/2011, recommnded repeat in 10 years, Dr. Fuller Plan.  HIV: Refused  Hep C: done

## 2015-08-25 NOTE — Patient Instructions (Addendum)
Try to get additional walking or water exercise as able.  Work on low [employer].

## 2015-08-25 NOTE — Assessment & Plan Note (Signed)
Well controlled. Continue current medication. Encouraged exercise, weight loss, healthy eating habits.  

## 2015-08-25 NOTE — Assessment & Plan Note (Signed)
Stable control. Encouraged exercise, weight loss, healthy eating habits.  

## 2015-08-25 NOTE — Progress Notes (Signed)
Pre visit review using our clinic review tool, if applicable. No additional management support is needed unless otherwise documented below in the visit note. 

## 2016-01-21 ENCOUNTER — Encounter: Payer: Self-pay | Admitting: Podiatry

## 2016-01-21 ENCOUNTER — Ambulatory Visit (INDEPENDENT_AMBULATORY_CARE_PROVIDER_SITE_OTHER): Payer: 59 | Admitting: Podiatry

## 2016-01-21 ENCOUNTER — Ambulatory Visit (INDEPENDENT_AMBULATORY_CARE_PROVIDER_SITE_OTHER): Payer: 59

## 2016-01-21 VITALS — BP 146/82 | HR 66 | Resp 16 | Ht 64.0 in | Wt 200.0 lb

## 2016-01-21 DIAGNOSIS — M722 Plantar fascial fibromatosis: Secondary | ICD-10-CM | POA: Diagnosis not present

## 2016-01-21 DIAGNOSIS — M79672 Pain in left foot: Secondary | ICD-10-CM

## 2016-01-21 MED ORDER — TRIAMCINOLONE ACETONIDE 10 MG/ML IJ SUSP
10.0000 mg | Freq: Once | INTRAMUSCULAR | Status: AC
Start: 2016-01-21 — End: 2016-01-21
  Administered 2016-01-21: 10 mg

## 2016-01-21 NOTE — Patient Instructions (Signed)

## 2016-01-21 NOTE — Progress Notes (Signed)
Subjective:     Patient ID: Veronica Hawkins, female   DOB: 17-Dec-1960, 55 y.o.   MRN: [id number]  HPI patient presents stating I have a lot of pain in my left arch that's been going on for several months and my left foot gets dry and that's been present for over a year. States that the heel arch is really bothering her and making it hard to walk and she does have 2 jobs   Review of Systems  All other systems reviewed and are negative.      Objective:   Physical Exam  Constitutional: She is oriented to person, place, and time.  Cardiovascular: Intact distal pulses.   Musculoskeletal: Normal range of motion.  Neurological: She is oriented to person, place, and time.  Skin: Skin is warm.  Nursing note and vitals reviewed.  neurovascular status intact muscle strength adequate range of motion within normal limits with patient found to have discomfort and inflammation in the left mid arch area with fluid buildup and noted to have moderate depression of the arch. The left foot is very dry and at times it itches in the right foot has mild change and I did not note any nail discoloration or change. Patient has good digital perfusion and is well oriented 3     Assessment:     Mid arch fasciitis left along with possibility for fungal or dermatological dermatitis condition left    Plan:     H&P and both conditions reviewed and x-rays reviewed. Injected the mid arch left 3 mg Kenalog 5 mg Xylocaine and applied fascial brace to lift the arch up and discussed oral Lamisil which I put her on for 45 days along with topical to help with dry skin. Reappoint to recheck in around 3 weeks  X-ray left was found to be noneventful with minimal spur formation mild depression of the arch and no stress fractures or arthritis noted

## 2016-01-21 NOTE — Progress Notes (Signed)
   Subjective:    Patient ID: Veronica Hawkins, female    DOB: [birth year], 55 y.o.   MRN: [id number]  HPI Chief Complaint  Patient presents with  . Foot Pain    Left foot; arch, heel & lateral; pt stated, "pain comes and goes at different times during the day"; x2 months      Review of Systems  All other systems reviewed and are negative.      Objective:   Physical Exam        Assessment & Plan:

## 2016-01-28 ENCOUNTER — Ambulatory Visit (INDEPENDENT_AMBULATORY_CARE_PROVIDER_SITE_OTHER): Payer: 59 | Admitting: Primary Care

## 2016-01-28 ENCOUNTER — Encounter: Payer: Self-pay | Admitting: Primary Care

## 2016-01-28 VITALS — BP 132/78 | HR 78 | Temp 99.5°F | Ht 64.0 in | Wt 205.8 lb

## 2016-01-28 DIAGNOSIS — J020 Streptococcal pharyngitis: Secondary | ICD-10-CM

## 2016-01-28 LAB — POCT RAPID STREP A (OFFICE): Rapid Strep A Screen: POSITIVE — AB

## 2016-01-28 MED ORDER — AMOXICILLIN 500 MG PO CAPS: 500.0000 mg | ORAL_CAPSULE | Freq: Two times a day (BID) | ORAL | Status: DC

## 2016-01-28 NOTE — Progress Notes (Signed)
Pre visit review using our clinic review tool, if applicable. No additional management support is needed unless otherwise documented below in the visit note. 

## 2016-01-28 NOTE — Addendum Note (Signed)
Addended by: Jacqualin Combes on: 01/28/2016 02:38 PM   Modules accepted: Orders, SmartSet

## 2016-01-28 NOTE — Progress Notes (Signed)
Subjective:    Patient ID: Veronica Hawkins, female    DOB: [birth year], 55 y.o.   MRN: [id number]  HPI  Veronica Hawkins is a 55 year old female who presents today with a chief complaint of sore throat. She also reports fever, ear pain, chills, body aches. Her symptoms began last night. Denies cough, nausea, vomiting, sick contacts. She's taken tylenol for her fevers with some improvement. Nothing makes her sore throat better. Talking makes her pain worse.  Review of Systems  Constitutional: Positive for fever, chills and fatigue.  HENT: Positive for ear pain, sinus pressure and sore throat. Negative for congestion.   Respiratory: Negative for cough and shortness of breath.   Musculoskeletal: Positive for myalgias.       Past Medical History  Diagnosis Date  . Allergy     seasonal  . Arthritis   . Hyperlipidemia      Social History   Social History  . Marital Status: Married    Spouse Name: N/A  . Number of Children: N/A  . Years of Education: N/A   Occupational History  . Not on file.   Social History Main Topics  . Smoking status: Former Smoker    Types: Cigarettes    Quit date: 03/16/2010  . Smokeless tobacco: Never Used  . Alcohol Use: No  . Drug Use: No  . Sexual Activity: Not on file   Other Topics Concern  . Not on file   Social History Narrative    Past Surgical History  Procedure Laterality Date  . Total hip arthroplasty  [id number]    Right hip  . Hip surgery      right to remove bands  . Shoulder arthroscopy      left ligaments and tendon repair and spurs  . Abdominal hysterectomy  1986  . Colonoscopy      No family history on file.  No Known Allergies  Current Outpatient Prescriptions on File Prior to Visit  Medication Sig Dispense Refill  . aspirin 81 MG tablet Take 81 mg by mouth daily.    . clobetasol ointment (TEMOVATE) 0.05 % APPLY TO AFFECTED AREA ON THE SKIN TWICE A DAY AS NEEDED  3  . loratadine (CLARITIN) 10 MG tablet Take 10  mg by mouth daily.    . Melatonin 10 MG TABS Take 1 tablet by mouth daily.    . meloxicam (MOBIC) 15 MG tablet Take 15 mg by mouth daily.    . simvastatin (ZOCOR) 40 MG tablet Take 1 tablet (40 mg total) by mouth daily. 90 tablet 3   No current facility-administered medications on file prior to visit.    BP 132/78 mmHg  Pulse 78  Temp(Src) 99.5 F (37.5 C) (Oral)  Ht 5\' 4"  (1.626 m)  Wt 205 lb 12.8 oz (93.35 kg)  BMI 35.31 kg/m2  SpO2 99%    Objective:   Physical Exam  Constitutional: She appears well-nourished.  HENT:  Right Ear: Tympanic membrane and ear canal normal.  Left Ear: Tympanic membrane and ear canal normal.  Nose: Right sinus exhibits no maxillary sinus tenderness and no frontal sinus tenderness. Left sinus exhibits no maxillary sinus tenderness and no frontal sinus tenderness.  Mouth/Throat: Oropharyngeal exudate, posterior oropharyngeal edema and posterior oropharyngeal erythema present.  Neck: Neck supple.  Cardiovascular: Normal rate and regular rhythm.   Pulmonary/Chest: Effort normal and breath sounds normal.  Skin: Skin is warm and dry.          Assessment &  Plan:    Sore throat, fever, chills, body aches since last night. Fevers running 100 on average. Exam today with moderate erythema, swelling, and presence of exudates. Rapid Strep: Positive Start Amoxil 500 mg BID 7 day course. Ibuprofen PRN and warm salt gargles. Fluids, rest, return precautions provided.

## 2016-01-28 NOTE — Patient Instructions (Signed)
Your strep test was positive today.  Start amoxicillin antibiotics. Take 1 tablet by mouth twice daily for 7 days.  Start ibuprofen 600 mg three times daily as needed for pain and inflammation.  Gargle warm salt water multiple times daily for pain.  Ensure you are staying hydrated.  Please notify me if no improvement in 3-4 days.  It was a pleasure meeting you!  Strep Throat Strep throat is a bacterial infection of the throat. Your health care provider may call the infection tonsillitis or pharyngitis, depending on whether there is swelling in the tonsils or at the back of the throat. Strep throat is most common during the cold months of the year in children who are 9-30 years of age, but it can happen during any season in people of any age. This infection is spread from person to person (contagious) through coughing, sneezing, or close contact. CAUSES Strep throat is caused by the bacteria called Streptococcus pyogenes. RISK FACTORS This condition is more likely to develop in:  People who spend time in crowded places where the infection can spread easily.  People who have close contact with someone who has strep throat. SYMPTOMS Symptoms of this condition include:  Fever or chills.   Redness, swelling, or pain in the tonsils or throat.  Pain or difficulty when swallowing.  White or yellow spots on the tonsils or throat.  Swollen, tender glands in the neck or under the jaw.  Red rash all over the body (rare). DIAGNOSIS This condition is diagnosed by performing a rapid strep test or by taking a swab of your throat (throat culture test). Results from a rapid strep test are usually ready in a few minutes, but throat culture test results are available after one or two days. TREATMENT This condition is treated with antibiotic medicine. HOME CARE INSTRUCTIONS Medicines  Take over-the-counter and prescription medicines only as told by your health care provider.  Take your  antibiotic as told by your health care provider. Do not stop taking the antibiotic even if you start to feel better.  Have family members who also have a sore throat or fever tested for strep throat. They may need antibiotics if they have the strep infection. Eating and Drinking  Do not share food, drinking cups, or personal items that could cause the infection to spread to other people.  If swallowing is difficult, try eating soft foods until your sore throat feels better.  Drink enough fluid to keep your urine clear or pale yellow. General Instructions  Gargle with a salt-water mixture 3-4 times per day or as needed. To make a salt-water mixture, completely dissolve -1 tsp of salt in 1 cup of warm water.  Make sure that all household members wash their hands well.  Get plenty of rest.  Stay home from school or work until you have been taking antibiotics for 24 hours.  Keep all follow-up visits as told by your health care provider. This is important. SEEK MEDICAL CARE IF:  The glands in your neck continue to get bigger.  You develop a rash, cough, or earache.  You cough up a thick liquid that is green, yellow-brown, or bloody.  You have pain or discomfort that does not get better with medicine.  Your problems seem to be getting worse rather than better.  You have a fever. SEEK IMMEDIATE MEDICAL CARE IF:  You have new symptoms, such as vomiting, severe headache, stiff or painful neck, chest pain, or shortness of breath.  You  have severe throat pain, drooling, or changes in your voice.  You have swelling of the neck, or the skin on the neck becomes red and tender.  You have signs of dehydration, such as fatigue, dry mouth, and decreased urination.  You become increasingly sleepy, or you cannot wake up completely.  Your joints become red or painful.   This information is not intended to replace advice given to you by your health care provider. Make sure you discuss any  questions you have with your health care provider.   Document Released: 08/19/2000 Document Revised: 05/13/2015 Document Reviewed: 12/15/2014 Elsevier Interactive Patient Education [employer].

## 2016-02-04 ENCOUNTER — Encounter: Payer: Self-pay | Admitting: Podiatry

## 2016-02-04 ENCOUNTER — Ambulatory Visit (INDEPENDENT_AMBULATORY_CARE_PROVIDER_SITE_OTHER): Payer: 59 | Admitting: Podiatry

## 2016-02-04 ENCOUNTER — Telehealth: Payer: Self-pay | Admitting: *Deleted

## 2016-02-04 DIAGNOSIS — B372 Candidiasis of skin and nail: Secondary | ICD-10-CM | POA: Diagnosis not present

## 2016-02-04 DIAGNOSIS — M722 Plantar fascial fibromatosis: Secondary | ICD-10-CM | POA: Diagnosis not present

## 2016-02-04 MED ORDER — TERBINAFINE HCL 250 MG PO TABS: 250.0000 mg | ORAL_TABLET | Freq: Every day | ORAL | Status: DC

## 2016-02-04 NOTE — Progress Notes (Signed)
Subjective:     Patient ID: Veronica Hawkins, female   DOB: [birth year], 55 y.o.   MRN: [id number]  HPI patient presents stating I'm improving but I'm still having a lot of thickness in my heel and into my arch with moderate discomfort within the arch   Review of Systems     Objective:   Physical Exam Neurovascular status intact muscle strength adequate with pain in the left arch that's inflamed at this time with the left heel showing quite a bit of thickness and dystrophic type tissue    Assessment:     Fasciitis of the left plantar mid arch area along with thickened tissue plantar left    Plan:     We will start her on 45 days of oral Lamisil and revitaderm cream and I advised on physical therapy and supportive shoes for the arch at this time

## 2016-02-04 NOTE — Telephone Encounter (Addendum)
Pt states she was seen this morning by Dr. Paulla Dolly and is confused and has a lot of questions. Unable to leave message no voicemail. I spoke with pt she states she didn't get any hot and cold pack.  I told pt they were out of stock and to use warm moist towels and alternate with the ice pack 3-4 times daily for about 10 to 15 minutes each.  Pt states she was told to use vaseline, saran wrap and white socks 2 times weekly, but what to do with Revitaderm40 and brush.  I told pt to use the Revitaderm40 on the days not using the vaseline until skin is without callous then use Revitaderm40 1-2 times weekly and the vaseline 1-2 times weekly.  Pt states understanding.

## 2016-03-17 ENCOUNTER — Ambulatory Visit: Payer: 59 | Admitting: Podiatry

## 2016-03-21 ENCOUNTER — Ambulatory Visit (INDEPENDENT_AMBULATORY_CARE_PROVIDER_SITE_OTHER): Payer: 59 | Admitting: Podiatry

## 2016-03-21 ENCOUNTER — Encounter: Payer: Self-pay | Admitting: Podiatry

## 2016-03-21 DIAGNOSIS — M722 Plantar fascial fibromatosis: Secondary | ICD-10-CM | POA: Diagnosis not present

## 2016-03-21 DIAGNOSIS — B372 Candidiasis of skin and nail: Secondary | ICD-10-CM

## 2016-03-21 MED ORDER — TRIAMCINOLONE ACETONIDE 10 MG/ML IJ SUSP
10.0000 mg | Freq: Once | INTRAMUSCULAR | Status: AC
Start: 2016-03-21 — End: 2016-03-21
  Administered 2016-03-21: 10 mg

## 2016-03-21 MED ORDER — TERBINAFINE HCL 250 MG PO TABS: ORAL_TABLET | ORAL | Status: DC

## 2016-03-23 NOTE — Progress Notes (Signed)
Subjective:     Patient ID: Veronica Hawkins, female   DOB: Jan 21, 1961, 55 y.o.   MRN: [id number]  HPI patient states I'm doing pretty well but I am still having some discomfort in the bottom of my heel   Review of Systems     Objective:   Physical Exam Neurovascular status unchanged with continued discomfort plantar heel left that's improved but is still sore upon deep palpation    Assessment:     Plantar fasciitis that has improved but is still painful    Plan:     Reinjected the plantar fascia 3 mg Kenalog 5 mg Xylocaine advised on physical therapy anti-inflammatories and reappoint to recheck

## 2016-05-18 ENCOUNTER — Other Ambulatory Visit: Payer: Self-pay | Admitting: Obstetrics and Gynecology

## 2016-05-18 DIAGNOSIS — Z1231 Encounter for screening mammogram for malignant neoplasm of breast: Secondary | ICD-10-CM

## 2016-06-01 ENCOUNTER — Ambulatory Visit
Admission: RE | Admit: 2016-06-01 | Discharge: 2016-06-01 | Disposition: A | Discharge status: Home / Self Care | Payer: 59 | Source: Ambulatory Visit | Attending: Obstetrics and Gynecology | Admitting: Obstetrics and Gynecology

## 2016-06-01 DIAGNOSIS — Z1231 Encounter for screening mammogram for malignant neoplasm of breast: Secondary | ICD-10-CM

## 2016-06-30 ENCOUNTER — Encounter: Payer: Self-pay | Admitting: Podiatry

## 2016-06-30 ENCOUNTER — Ambulatory Visit (INDEPENDENT_AMBULATORY_CARE_PROVIDER_SITE_OTHER): Payer: 59 | Admitting: Podiatry

## 2016-06-30 DIAGNOSIS — B372 Candidiasis of skin and nail: Secondary | ICD-10-CM

## 2016-06-30 DIAGNOSIS — M722 Plantar fascial fibromatosis: Secondary | ICD-10-CM | POA: Diagnosis not present

## 2016-06-30 DIAGNOSIS — M21619 Bunion of unspecified foot: Secondary | ICD-10-CM

## 2016-06-30 NOTE — Progress Notes (Signed)
Subjective:     Patient ID: Veronica Hawkins, female   DOB: Oct 07, 1960, 55 y.o.   MRN: [id number]  HPI patient presents states the foot feels good but at times she feels a popping sensation   Review of Systems     Objective:   Physical Exam Neurovascular status intact with discomfort which is moderated left foot with no indications of significant pathology upon palpation    Assessment:     Inflammatory changes which is not appear to be symptomatic at the current time with probable fluid accumulation which creates a popping-like sensation    Plan:     Advised on return to normal activity and reappoint as needed for recheck

## 2016-07-11 ENCOUNTER — Encounter: Payer: Self-pay | Admitting: Family Medicine

## 2016-08-18 ENCOUNTER — Telehealth: Payer: Self-pay | Admitting: Family Medicine

## 2016-08-18 DIAGNOSIS — E7800 Pure hypercholesterolemia, unspecified: Secondary | ICD-10-CM

## 2016-08-18 DIAGNOSIS — R7303 Prediabetes: Secondary | ICD-10-CM

## 2016-08-18 NOTE — Telephone Encounter (Signed)
-----   Message from Marchia Bond sent at 08/17/2016 11:49 AM EST ----- Regarding: Cpx labs Wed 12/20, need orders. Thanks! :-) Please order  future cpx labs for pt's upcoming lab appt. Thanks Aniceto Boss

## 2016-08-25 ENCOUNTER — Other Ambulatory Visit (INDEPENDENT_AMBULATORY_CARE_PROVIDER_SITE_OTHER): Payer: 59

## 2016-08-25 DIAGNOSIS — R7303 Prediabetes: Secondary | ICD-10-CM

## 2016-08-25 DIAGNOSIS — E7800 Pure hypercholesterolemia, unspecified: Secondary | ICD-10-CM

## 2016-08-25 LAB — COMPREHENSIVE METABOLIC PANEL
ALBUMIN: 4.1 g/dL (ref 3.5–5.2)
ALT: 14 U/L (ref 0–35)
AST: 14 U/L (ref 0–37)
Alkaline Phosphatase: 48 U/L (ref 39–117)
BUN: 20 mg/dL (ref 6–23)
CHLORIDE: 108 meq/L (ref 96–112)
CO2: 31 mEq/L (ref 19–32)
CREATININE: 0.76 mg/dL (ref 0.40–1.20)
Calcium: 9.4 mg/dL (ref 8.4–10.5)
GFR: 83.09 mL/min (ref >60.00)
GLUCOSE: 104 mg/dL — AB (ref 70–99)
POTASSIUM: 4.4 meq/L (ref 3.5–5.1)
SODIUM: 142 meq/L (ref 135–145)
Total Bilirubin: 0.4 mg/dL (ref 0.2–1.2)
Total Protein: 7 g/dL (ref 6.0–8.3)

## 2016-08-25 LAB — LIPID PANEL
CHOLESTEROL: 155 mg/dL (ref 0–200)
HDL: 50.2 mg/dL (ref >39.00)
LDL CALC: 85 mg/dL (ref 0–99)
NONHDL: 104.58
Total CHOL/HDL Ratio: 3
Triglycerides: 98 mg/dL (ref 0.0–149.0)
VLDL: 19.6 mg/dL (ref 0.0–40.0)

## 2016-08-25 LAB — HEMOGLOBIN A1C: HEMOGLOBIN A1C: 5.8 % (ref 4.6–6.5)

## 2016-09-02 ENCOUNTER — Ambulatory Visit (INDEPENDENT_AMBULATORY_CARE_PROVIDER_SITE_OTHER): Payer: 59 | Admitting: Family Medicine

## 2016-09-02 ENCOUNTER — Encounter: Payer: Self-pay | Admitting: Family Medicine

## 2016-09-02 VITALS — BP 122/74 | HR 70 | Temp 98.0°F | Ht 63.25 in | Wt 204.0 lb

## 2016-09-02 DIAGNOSIS — J0100 Acute maxillary sinusitis, unspecified: Secondary | ICD-10-CM

## 2016-09-02 DIAGNOSIS — Z0000 Encounter for general adult medical examination without abnormal findings: Secondary | ICD-10-CM

## 2016-09-02 MED ORDER — PREDNISONE 20 MG PO TABS: ORAL_TABLET | ORAL | 0 refills | Status: DC

## 2016-09-02 MED ORDER — SIMVASTATIN 40 MG PO TABS: 40.0000 mg | ORAL_TABLET | Freq: Every day | ORAL | 3 refills | Status: DC

## 2016-09-02 MED ORDER — AMOXICILLIN 500 MG PO CAPS: 1000.0000 mg | ORAL_CAPSULE | Freq: Two times a day (BID) | ORAL | 0 refills | Status: DC

## 2016-09-02 NOTE — Patient Instructions (Addendum)
Start mucinex D  twice daily. Start nasal saline irrigation or spray 2-3 times daily.  Start prednisone course for 5 days.  If not improving in 3-4 days or if new fever...start antibitoics.   Keep working on healthy eating, regular exercise as tolerated and weight loss.

## 2016-09-02 NOTE — Assessment & Plan Note (Signed)
Possible bacterial infection given length of symptoms, but no fever, not unilateral pain.  Triall of mucolytic, irrigation and prednisone.. If not improving or new fever.. Treat with antibiotics.

## 2016-09-02 NOTE — Progress Notes (Signed)
Subjective:    Patient ID: Veronica Hawkins, female    DOB: 1961-06-30, 55 y.o.   MRN: [id number]  HPI The patient is here for annual wellness exam and preventative care.   She has been having 1.5 week of nasal congestion,  Bilateral facial pressure. No ear pain.  Upper teeth hurting.  No fever.. But chills. Scratchy throat. No cough, no SOB.  She has been using nasal decongestant and [facility] cold and sinus.  Using nyquil cold and flu.  Hasn't helped much.  Elevated Cholesterol: Well controlled on simvastatin 40 mg daily. Lab Results  Component Value Date   CHOL 155 08/25/2016   HDL 50.20 08/25/2016   LDLCALC 85 08/25/2016   LDLDIRECT 173.2 03/19/2007   TRIG 98.0 08/25/2016   CHOLHDL 3 08/25/2016  Using medications without problems:None Muscle aches: None Diet compliance: moderate Exercise: trying to stay active. Other complaints:   prediabetes:  Lab Results  Component Value Date   HGBA1C 5.8 08/25/2016   BP Readings from Last 3 Encounters:  09/02/16 122/74  01/28/16 132/78  01/21/16 (!) 146/82   Wt Readings from Last 3 Encounters:  09/02/16 204 lb (92.5 kg)  01/28/16 205 lb 12.8 oz (93.4 kg)  01/21/16 200 lb (90.7 kg)  Body mass index is 35.85 kg/m.    Social History /Family History/Past Medical History reviewed and updated if needed. Patient Care Team: Jinny Sanders, MD as PCP - General Review of Systems  Constitutional: Negative for fatigue and fever.  HENT: Negative for congestion.   Eyes: Negative for pain.  Respiratory: Negative for cough and shortness of breath.   Cardiovascular: Negative for chest pain, palpitations and leg swelling.  Gastrointestinal: Negative for abdominal pain.  Genitourinary: Negative for dysuria and vaginal bleeding.  Musculoskeletal: Positive for back pain.  Neurological: Negative for syncope, light-headedness and headaches.  Psychiatric/Behavioral: Negative for dysphoric mood.       Objective:   Physical Exam    Constitutional: Vital signs are normal. She appears well-developed and well-nourished. She is cooperative.  Non-toxic appearance. She does not appear ill. No distress.  HENT:  Head: Normocephalic.  Right Ear: Hearing, external ear and ear canal normal. A middle ear effusion is present.  Left Ear: Hearing, external ear and ear canal normal. A middle ear effusion is present.  Nose: Right sinus exhibits maxillary sinus tenderness and frontal sinus tenderness. Left sinus exhibits maxillary sinus tenderness and frontal sinus tenderness.  Eyes: Conjunctivae, EOM and lids are normal. Pupils are equal, round, and reactive to light. Lids are everted and swept, no foreign bodies found.  Neck: Trachea normal and normal range of motion. Neck supple. Carotid bruit is not present. No thyroid mass and no thyromegaly present.  Cardiovascular: Normal rate, regular rhythm, S1 normal, S2 normal, normal heart sounds and intact distal pulses.  Exam reveals no gallop.   No murmur heard. Pulmonary/Chest: Effort normal and breath sounds normal. No respiratory distress. She has no wheezes. She has no rhonchi. She has no rales.  Abdominal: Soft. Normal appearance and bowel sounds are normal. She exhibits no distension, no fluid wave, no abdominal bruit and no mass. There is no hepatosplenomegaly. There is no tenderness. There is no rebound, no guarding and no CVA tenderness. No hernia.  Lymphadenopathy:    She has no cervical adenopathy.    She has no axillary adenopathy.  Neurological: She is alert. She has normal strength. No cranial nerve deficit or sensory deficit.  Skin: Skin is warm, dry and intact.  No rash noted.  Psychiatric: Her speech is normal and behavior is normal. Judgment normal. Her mood appears not anxious. Cognition and memory are normal. She does not exhibit a depressed mood.          Assessment & Plan:  The patient's preventative maintenance and recommended screening tests for an annual wellness  exam were reviewed in full today. Brought up to date unless services declined.  Counselled on the importance of diet, exercise, and its role in overall health and mortality. The patient's FH and SH was reviewed, including their home life, tobacco status, and drug and alcohol status.   Vaccines: uptodate Pap/DVE:  Partial hysterectomy.. Followed by GYN every other year. Mammo: 05/2016 nml Bone Density: Nml years ago. Colon: nml 01/2011, recommnded repeat in 10 years, Dr. Fuller Plan. Smoking Status: former,25 pack year history. Asymptomatic, nml spirometry, last CXR 2013 nml..  ETOH/ drug [id number]  Hep C:  done  HIV screen:   refused

## 2016-09-09 ENCOUNTER — Telehealth: Payer: Self-pay

## 2016-09-09 NOTE — Telephone Encounter (Signed)
Flonase 2 sprays per nostril daily OTC and decongestant like sudafed x 7-0 days

## 2016-09-09 NOTE — Telephone Encounter (Signed)
[name] notified as instructed by telephone.

## 2016-09-09 NOTE — Telephone Encounter (Signed)
Pt states she was here last Friday for a physical was put on an abx and prednisone she finished those and she still has fluid "behind" her ear and is wondering if there's anything else that she can take/do.

## 2016-09-18 ENCOUNTER — Other Ambulatory Visit: Payer: Self-pay | Admitting: Family Medicine

## 2016-09-27 DIAGNOSIS — M150 Primary generalized (osteo)arthritis: Secondary | ICD-10-CM | POA: Diagnosis not present

## 2016-09-27 DIAGNOSIS — L309 Dermatitis, unspecified: Secondary | ICD-10-CM | POA: Diagnosis not present

## 2016-09-27 DIAGNOSIS — M2550 Pain in unspecified joint: Secondary | ICD-10-CM | POA: Diagnosis not present

## 2016-11-10 DIAGNOSIS — L400 Psoriasis vulgaris: Secondary | ICD-10-CM | POA: Diagnosis not present

## 2016-11-10 DIAGNOSIS — Z79899 Other long term (current) drug therapy: Secondary | ICD-10-CM | POA: Diagnosis not present

## 2016-11-14 DIAGNOSIS — N8111 Cystocele, midline: Secondary | ICD-10-CM | POA: Diagnosis not present

## 2016-11-14 DIAGNOSIS — R3121 Asymptomatic microscopic hematuria: Secondary | ICD-10-CM | POA: Diagnosis not present

## 2016-11-14 DIAGNOSIS — Z01419 Encounter for gynecological examination (general) (routine) without abnormal findings: Secondary | ICD-10-CM | POA: Diagnosis not present

## 2016-11-30 DIAGNOSIS — H11159 Pinguecula, unspecified eye: Secondary | ICD-10-CM | POA: Diagnosis not present

## 2017-02-14 DIAGNOSIS — L400 Psoriasis vulgaris: Secondary | ICD-10-CM | POA: Diagnosis not present

## 2017-02-23 DIAGNOSIS — Z79899 Other long term (current) drug therapy: Secondary | ICD-10-CM | POA: Diagnosis not present

## 2017-02-23 DIAGNOSIS — L0889 Other specified local infections of the skin and subcutaneous tissue: Secondary | ICD-10-CM | POA: Diagnosis not present

## 2017-02-23 DIAGNOSIS — L400 Psoriasis vulgaris: Secondary | ICD-10-CM | POA: Diagnosis not present

## 2017-03-09 DIAGNOSIS — Z79899 Other long term (current) drug therapy: Secondary | ICD-10-CM | POA: Diagnosis not present

## 2017-03-09 DIAGNOSIS — L400 Psoriasis vulgaris: Secondary | ICD-10-CM | POA: Diagnosis not present

## 2017-03-22 DIAGNOSIS — R339 Retention of urine, unspecified: Secondary | ICD-10-CM | POA: Diagnosis not present

## 2017-03-22 DIAGNOSIS — R300 Dysuria: Secondary | ICD-10-CM | POA: Diagnosis not present

## 2017-04-18 DIAGNOSIS — Z79899 Other long term (current) drug therapy: Secondary | ICD-10-CM | POA: Diagnosis not present

## 2017-04-18 DIAGNOSIS — L400 Psoriasis vulgaris: Secondary | ICD-10-CM | POA: Diagnosis not present

## 2017-05-02 DIAGNOSIS — Z23 Encounter for immunization: Secondary | ICD-10-CM | POA: Diagnosis not present

## 2017-05-23 ENCOUNTER — Other Ambulatory Visit: Payer: Self-pay | Admitting: Obstetrics and Gynecology

## 2017-05-23 DIAGNOSIS — Z1231 Encounter for screening mammogram for malignant neoplasm of breast: Secondary | ICD-10-CM

## 2017-06-08 ENCOUNTER — Ambulatory Visit: Payer: 59

## 2017-06-09 ENCOUNTER — Ambulatory Visit
Admission: RE | Admit: 2017-06-09 | Discharge: 2017-06-09 | Disposition: A | Discharge status: Home / Self Care | Payer: 59 | Source: Ambulatory Visit | Attending: Obstetrics and Gynecology | Admitting: Obstetrics and Gynecology

## 2017-06-09 DIAGNOSIS — Z1231 Encounter for screening mammogram for malignant neoplasm of breast: Secondary | ICD-10-CM

## 2017-07-18 DIAGNOSIS — L400 Psoriasis vulgaris: Secondary | ICD-10-CM | POA: Diagnosis not present

## 2017-07-18 DIAGNOSIS — Z79899 Other long term (current) drug therapy: Secondary | ICD-10-CM | POA: Diagnosis not present

## 2017-09-07 ENCOUNTER — Telehealth: Payer: Self-pay | Admitting: Family Medicine

## 2017-09-07 ENCOUNTER — Other Ambulatory Visit: Payer: 59

## 2017-09-07 DIAGNOSIS — R7303 Prediabetes: Secondary | ICD-10-CM

## 2017-09-07 DIAGNOSIS — E7800 Pure hypercholesterolemia, unspecified: Secondary | ICD-10-CM

## 2017-09-07 NOTE — Telephone Encounter (Signed)
-----   Message from Veronica Hawkins sent at 08/30/2017  2:29 PM EST ----- Regarding: Lab orders for Thursday, 1.3.19 Patient is scheduled for CPX labs, please order future labs, Thanks , Karna Christmas

## 2017-09-08 ENCOUNTER — Encounter: Payer: 59 | Admitting: Family Medicine

## 2017-10-08 ENCOUNTER — Other Ambulatory Visit: Payer: Self-pay | Admitting: Family Medicine

## 2017-10-17 ENCOUNTER — Encounter: Payer: Self-pay | Admitting: Family Medicine

## 2017-10-17 DIAGNOSIS — Z79899 Other long term (current) drug therapy: Secondary | ICD-10-CM | POA: Diagnosis not present

## 2017-10-17 DIAGNOSIS — L400 Psoriasis vulgaris: Secondary | ICD-10-CM | POA: Diagnosis not present

## 2017-10-17 DIAGNOSIS — L570 Actinic keratosis: Secondary | ICD-10-CM | POA: Diagnosis not present

## 2017-10-24 DIAGNOSIS — L0889 Other specified local infections of the skin and subcutaneous tissue: Secondary | ICD-10-CM | POA: Diagnosis not present

## 2017-11-03 ENCOUNTER — Encounter: Payer: Self-pay | Admitting: Family Medicine

## 2017-11-03 ENCOUNTER — Other Ambulatory Visit (INDEPENDENT_AMBULATORY_CARE_PROVIDER_SITE_OTHER): Payer: 59

## 2017-11-03 ENCOUNTER — Ambulatory Visit: Payer: 59 | Admitting: Family Medicine

## 2017-11-03 VITALS — BP 122/64 | HR 70 | Temp 98.7°F | Wt 200.5 lb

## 2017-11-03 DIAGNOSIS — E7800 Pure hypercholesterolemia, unspecified: Secondary | ICD-10-CM | POA: Diagnosis not present

## 2017-11-03 DIAGNOSIS — J029 Acute pharyngitis, unspecified: Secondary | ICD-10-CM

## 2017-11-03 DIAGNOSIS — J020 Streptococcal pharyngitis: Secondary | ICD-10-CM | POA: Diagnosis not present

## 2017-11-03 DIAGNOSIS — R7303 Prediabetes: Secondary | ICD-10-CM | POA: Diagnosis not present

## 2017-11-03 LAB — COMPREHENSIVE METABOLIC PANEL
ALT: 21 U/L (ref 0–35)
AST: 19 U/L (ref 0–37)
Albumin: 4 g/dL (ref 3.5–5.2)
Alkaline Phosphatase: 48 U/L (ref 39–117)
BUN: 15 mg/dL (ref 6–23)
CHLORIDE: 105 meq/L (ref 96–112)
CO2: 29 mEq/L (ref 19–32)
Calcium: 9.9 mg/dL (ref 8.4–10.5)
Creatinine, Ser: 0.81 mg/dL (ref 0.40–1.20)
GFR: 76.88 mL/min (ref >60.00)
GLUCOSE: 100 mg/dL — AB (ref 70–99)
POTASSIUM: 4.5 meq/L (ref 3.5–5.1)
SODIUM: 141 meq/L (ref 135–145)
Total Bilirubin: 0.4 mg/dL (ref 0.2–1.2)
Total Protein: 7.6 g/dL (ref 6.0–8.3)

## 2017-11-03 LAB — LIPID PANEL
CHOL/HDL RATIO: 3
Cholesterol: 149 mg/dL (ref 0–200)
HDL: 55.1 mg/dL (ref >39.00)
LDL CALC: 80 mg/dL (ref 0–99)
NONHDL: 93.92
Triglycerides: 71 mg/dL (ref 0.0–149.0)
VLDL: 14.2 mg/dL (ref 0.0–40.0)

## 2017-11-03 LAB — HEMOGLOBIN A1C: HEMOGLOBIN A1C: 5.9 % (ref 4.6–6.5)

## 2017-11-03 LAB — POCT RAPID STREP A (OFFICE): RAPID STREP A SCREEN: POSITIVE — AB

## 2017-11-03 MED ORDER — AMOXICILLIN 500 MG PO CAPS: 500.0000 mg | ORAL_CAPSULE | Freq: Three times a day (TID) | ORAL | 0 refills | Status: DC

## 2017-11-03 NOTE — Assessment & Plan Note (Signed)
In pt who takes methotrexate  Px amoxicillin 500 mg tid  Disc contagious status Supportive care-analgesics for ST and gargles  Update if not starting to improve in a week or if worsening

## 2017-11-03 NOTE — Patient Instructions (Signed)
You have strep throat  Drink lots of fluids and rest  Try salt water gargles  Tylenol or ibuprofen help sore throat  Chloraseptic throat spray helps also  Start the amoxicillin right away and take as directed   Update if not starting to improve in a week or if worsening

## 2017-11-03 NOTE — Progress Notes (Signed)
Subjective:    Patient ID: Veronica Hawkins, female    DOB: [birth year], 57 y.o.   MRN: [id number]  HPI Here for symptom of ST Her grand child 2 weeks ago   Started ST in the middle of the night  Hurts to swallow  R ear hurts also (it often gets fluid in it) No cold or congestion   No fever Temp: 98.7 F (37.1 C)   No n/v  Little loose stool yesterday  No rash  No headache   Rapid strep test is positive today  Results for orders placed or performed in visit on 11/03/17  Rapid Strep A  Result Value Ref Range   Rapid Strep A Screen Positive (A) Negative     She takes methotrexate - for skin condition    Patient Active Problem List   Diagnosis Date Noted  . Strep pharyngitis 11/03/2017  . Bladder cystocele 08/19/2014  . Bilateral carpal tunnel syndrome 07/27/2012  . Prediabetes 06/10/2011  . Seasonal allergies 06/10/2011  . Sinusitis, acute 10/16/2009  . HYPERCHOLESTEROLEMIA, PURE 03/28/2007  . FIBROCYSTIC BREAST DISEASE 03/15/2007   Past Medical History:  Diagnosis Date  . Allergy    seasonal  . Arthritis   . Hyperlipidemia    Past Surgical History:  Procedure Laterality Date  . ABDOMINAL HYSTERECTOMY  1986  . COLONOSCOPY    . HIP SURGERY     right to remove bands  . SHOULDER ARTHROSCOPY     left ligaments and tendon repair and spurs  . TOTAL HIP ARTHROPLASTY  [id number]   Right hip   Social History   Tobacco Use  . Smoking status: Former Smoker    Types: Cigarettes    Last attempt to quit: 03/16/2010    Years since quitting: 7.6  . Smokeless tobacco: Never Used  Substance Use Topics  . Alcohol use: No  . Drug use: No   History reviewed. No pertinent family history. No Known Allergies Current Outpatient Medications on File Prior to Visit  Medication Sig Dispense Refill  . aspirin 81 MG tablet Take 81 mg by mouth daily.    . Cholecalciferol (VITAMIN D-3) 1000 units CAPS Take 2 capsules by mouth daily.    . clobetasol ointment (TEMOVATE)  0.05 % APPLY TO AFFECTED AREA ON THE SKIN TWICE A DAY AS NEEDED  3  . folic acid (FOLVITE) 1 MG tablet Take 1 tablet by mouth daily.    Marland Kitchen loratadine (CLARITIN) 10 MG tablet Take 10 mg by mouth daily.    . Magnesium 500 MG TABS Take 1 tablet by mouth daily.    . Melatonin 10 MG TABS Take 1 tablet by mouth daily.    . methotrexate (RHEUMATREX) 2.5 MG tablet Take 8 tablets by mouth once a week.    . simvastatin (ZOCOR) 40 MG tablet TAKE 1 TABLET (40 MG TOTAL) BY MOUTH DAILY. 90 tablet 0   No current facility-administered medications on file prior to visit.     Review of Systems  Constitutional: Positive for fatigue. Negative for activity change, appetite change, fever and unexpected weight change.  HENT: Positive for ear pain and sore throat. Negative for congestion, rhinorrhea and sinus pressure.   Eyes: Negative for pain, redness and visual disturbance.  Respiratory: Negative for cough, shortness of breath and wheezing.   Cardiovascular: Negative for chest pain and palpitations.  Gastrointestinal: Negative for abdominal pain, blood in stool, constipation and diarrhea.  Endocrine: Negative for polydipsia and polyuria.  Genitourinary: Negative for dysuria, frequency  and urgency.  Musculoskeletal: Negative for arthralgias, back pain and myalgias.  Skin: Negative for pallor and rash.  Allergic/Immunologic: Negative for environmental allergies.  Neurological: Negative for dizziness, syncope and headaches.  Hematological: Negative for adenopathy. Does not bruise/bleed easily.  Psychiatric/Behavioral: Negative for decreased concentration and dysphoric mood. The patient is not nervous/anxious.        Objective:   Physical Exam  Constitutional: She appears well-developed and well-nourished. No distress.  overwt and well app  HENT:  Head: Normocephalic and atraumatic.  Right Ear: External ear normal.  Left Ear: External ear normal.  Nose: Nose normal.  Nares are boggy but clear  No sinus  tenderness  TMs clear  Throat- diffuse erythema w/o swelling or exudate   Eyes: Conjunctivae and EOM are normal. Pupils are equal, round, and reactive to light. Right eye exhibits no discharge. Left eye exhibits no discharge. No scleral icterus.  Neck: Normal range of motion. Neck supple.  Cardiovascular: Normal rate, regular rhythm and normal heart sounds.  No murmur heard. Pulmonary/Chest: Effort normal and breath sounds normal. No respiratory distress. She has no rales.  Lymphadenopathy:    She has no cervical adenopathy.  Neurological: She is alert.  Skin: Skin is warm and dry. No rash noted. No erythema. No pallor.  Eczema on hands  Psychiatric: She has a normal mood and affect.          Assessment & Plan:   Problem List Items Addressed This Visit      Respiratory   Strep pharyngitis - Primary    In pt who takes methotrexate  Px amoxicillin 500 mg tid  Disc contagious status Supportive care-analgesics for ST and gargles  Update if not starting to improve in a week or if worsening         Other Visit Diagnoses    Sore throat       Relevant Orders   Rapid Strep A (Completed)

## 2017-11-07 ENCOUNTER — Encounter: Payer: 59 | Admitting: Family Medicine

## 2017-11-09 ENCOUNTER — Ambulatory Visit (INDEPENDENT_AMBULATORY_CARE_PROVIDER_SITE_OTHER): Payer: 59 | Admitting: Family Medicine

## 2017-11-09 ENCOUNTER — Other Ambulatory Visit: Payer: Self-pay

## 2017-11-09 ENCOUNTER — Encounter: Payer: Self-pay | Admitting: Family Medicine

## 2017-11-09 VITALS — BP 120/80 | HR 71 | Temp 98.6°F | Ht 63.25 in | Wt 202.2 lb

## 2017-11-09 DIAGNOSIS — F5104 Psychophysiologic insomnia: Secondary | ICD-10-CM

## 2017-11-09 DIAGNOSIS — R7303 Prediabetes: Secondary | ICD-10-CM | POA: Diagnosis not present

## 2017-11-09 DIAGNOSIS — E7800 Pure hypercholesterolemia, unspecified: Secondary | ICD-10-CM | POA: Diagnosis not present

## 2017-11-09 DIAGNOSIS — Z0000 Encounter for general adult medical examination without abnormal findings: Secondary | ICD-10-CM

## 2017-11-09 MED ORDER — TRAZODONE HCL 50 MG PO TABS: 25.0000 mg | ORAL_TABLET | Freq: Every evening | ORAL | 1 refills | Status: DC | PRN

## 2017-11-09 NOTE — Assessment & Plan Note (Signed)
Increase stress for loss of spouse. Trail of trazodone.

## 2017-11-09 NOTE — Progress Notes (Signed)
Subjective:    Patient ID: Veronica Hawkins, female    DOB: May 17, 1961, 57 y.o.   MRN: [id number]  HPI   The patient is here for annual wellness exam and preventative care.    her husband passed away from colon cancer in 09/2017   She is doing fairly well except trouble sleeping at night.  She 2 benadryl and 2 melatonin.  Not helping much.  Denies depression.  no anxiety.  Elevated Cholesterol:  LDL at goal on simvastatin 40 mg daily. Lab Results  Component Value Date   CHOL 149 11/03/2017   HDL 55.10 11/03/2017   LDLCALC 80 11/03/2017   LDLDIRECT 173.2 03/19/2007   TRIG 71.0 11/03/2017   CHOLHDL 3 11/03/2017   prediabetes:  Lab Results  Component Value Date   HGBA1C 5.9 11/03/2017  Using medications without problems: none Muscle aches: none Diet compliance: healthy Exercise: going 3 times a week Other complaints:   She is on methorexate for eczema/ psoriasis.Marland Kitchen Has helped dramatically. Plans to wean off.  Dr. Elvera Lennox, Derm.  Social History /Family History/Past Medical History reviewed in detail and updated in EMR if needed. Blood pressure 120/80, pulse 71, temperature 98.6 F (37 C), temperature source Oral, height 5' 3.25" (1.607 m), weight 202 lb 4 oz (91.7 kg).   Review of Systems  Constitutional: Positive for fatigue. Negative for fever.  HENT: Negative for congestion.   Eyes: Negative for pain.  Respiratory: Negative for cough and shortness of breath.   Cardiovascular: Negative for chest pain, palpitations and leg swelling.  Gastrointestinal: Negative for abdominal pain.  Genitourinary: Negative for dysuria and vaginal bleeding.  Musculoskeletal: Negative for back pain.  Neurological: Negative for syncope, light-headedness and headaches.  Psychiatric/Behavioral: Positive for sleep disturbance. Negative for confusion, dysphoric mood, self-injury and suicidal ideas.       Objective:   Physical Exam  Constitutional: Vital signs are normal. She appears  well-developed and well-nourished. She is cooperative.  Non-toxic appearance. She does not appear ill. No distress.  Overweight   HENT:  Head: Normocephalic.  Right Ear: Hearing, tympanic membrane, external ear and ear canal normal.  Left Ear: Hearing, tympanic membrane, external ear and ear canal normal.  Nose: Nose normal.  Eyes: Conjunctivae, EOM and lids are normal. Pupils are equal, round, and reactive to light. Lids are everted and swept, no foreign bodies found.  Neck: Trachea normal and normal range of motion. Neck supple. Carotid bruit is not present. No thyroid mass and no thyromegaly present.  Cardiovascular: Normal rate, regular rhythm, S1 normal, S2 normal, normal heart sounds and intact distal pulses. Exam reveals no gallop.  No murmur heard. Pulmonary/Chest: Effort normal and breath sounds normal. No respiratory distress. She has no wheezes. She has no rhonchi. She has no rales.  Abdominal: Soft. Normal appearance and bowel sounds are normal. She exhibits no distension, no fluid wave, no abdominal bruit and no mass. There is no hepatosplenomegaly. There is no tenderness. There is no rebound, no guarding and no CVA tenderness. No hernia.  Lymphadenopathy:    She has no cervical adenopathy.    She has no axillary adenopathy.  Neurological: She is alert. She has normal strength. No cranial nerve deficit or sensory deficit.  Skin: Skin is warm, dry and intact. No rash noted.  Psychiatric: Her speech is normal and behavior is normal. Judgment normal. Her mood appears not anxious. Cognition and memory are normal. She does not exhibit a depressed mood.  Assessment & Plan:  The patient's preventative maintenance and recommended screening tests for an annual wellness exam were reviewed in full today. Brought up to date unless services declined.  Counselled on the importance of diet, exercise, and its role in overall health and mortality. The patient's FH and SH was  reviewed, including their home life, tobacco status, and drug and alcohol status.   Vaccines: uptodate td and flu Pap/DVE:  Partial hysterectomy.. Followed by GYN every other year. Mammo: 06/2017 nml Bone Density: Nml years ago. Plan repeat age 6-65 Colon: nml 01/2011, recommnded repeat in 10 years, Dr. Fuller Plan. Smoking Status: former,25 pack year history. Asymptomatic, nml spirometry, last CXR 2013 nml.. QUIT 2011 ETOH/ drug [facility]/PJRP Hep C:  done HIV screen:  refused

## 2017-11-09 NOTE — Assessment & Plan Note (Signed)
Well controlled. Continue current medication.  

## 2017-11-09 NOTE — Patient Instructions (Signed)
Stop melatonin and benadryl.  Can try a trial of trazodone.

## 2017-11-09 NOTE — Assessment & Plan Note (Signed)
Encouraged exercise, weight loss, healthy eating habits. ? ?

## 2017-11-15 DIAGNOSIS — Z01419 Encounter for gynecological examination (general) (routine) without abnormal findings: Secondary | ICD-10-CM | POA: Diagnosis not present

## 2017-11-15 DIAGNOSIS — Z130 Encounter for screening for diseases of the blood and blood-forming organs and certain disorders involving the immune mechanism: Secondary | ICD-10-CM | POA: Diagnosis not present

## 2017-11-15 DIAGNOSIS — Z1389 Encounter for screening for other disorder: Secondary | ICD-10-CM | POA: Diagnosis not present

## 2017-11-29 ENCOUNTER — Encounter: Payer: Self-pay | Admitting: Family Medicine

## 2017-11-30 MED ORDER — TRAZODONE HCL 50 MG PO TABS: 100.0000 mg | ORAL_TABLET | Freq: Every evening | ORAL | 1 refills | Status: DC | PRN

## 2017-12-05 DIAGNOSIS — H11159 Pinguecula, unspecified eye: Secondary | ICD-10-CM | POA: Diagnosis not present

## 2017-12-21 DIAGNOSIS — Z96641 Presence of right artificial hip joint: Secondary | ICD-10-CM | POA: Diagnosis not present

## 2017-12-21 DIAGNOSIS — Z471 Aftercare following joint replacement surgery: Secondary | ICD-10-CM | POA: Diagnosis not present

## 2017-12-21 DIAGNOSIS — M1611 Unilateral primary osteoarthritis, right hip: Secondary | ICD-10-CM | POA: Diagnosis not present

## 2018-01-15 DIAGNOSIS — L401 Generalized pustular psoriasis: Secondary | ICD-10-CM | POA: Diagnosis not present

## 2018-01-15 DIAGNOSIS — L400 Psoriasis vulgaris: Secondary | ICD-10-CM | POA: Diagnosis not present

## 2018-01-15 DIAGNOSIS — Z79899 Other long term (current) drug therapy: Secondary | ICD-10-CM | POA: Diagnosis not present

## 2018-01-31 ENCOUNTER — Other Ambulatory Visit: Payer: Self-pay | Admitting: Family Medicine

## 2018-03-03 ENCOUNTER — Other Ambulatory Visit: Payer: Self-pay | Admitting: Family Medicine

## 2018-03-25 ENCOUNTER — Other Ambulatory Visit: Payer: Self-pay | Admitting: Family Medicine

## 2018-04-17 DIAGNOSIS — Z79899 Other long term (current) drug therapy: Secondary | ICD-10-CM | POA: Diagnosis not present

## 2018-04-17 DIAGNOSIS — L918 Other hypertrophic disorders of the skin: Secondary | ICD-10-CM | POA: Diagnosis not present

## 2018-04-17 DIAGNOSIS — L400 Psoriasis vulgaris: Secondary | ICD-10-CM | POA: Diagnosis not present

## 2018-05-02 DIAGNOSIS — M1811 Unilateral primary osteoarthritis of first carpometacarpal joint, right hand: Secondary | ICD-10-CM | POA: Diagnosis not present

## 2018-05-02 DIAGNOSIS — M180 Bilateral primary osteoarthritis of first carpometacarpal joints: Secondary | ICD-10-CM | POA: Diagnosis not present

## 2018-05-11 DIAGNOSIS — Z23 Encounter for immunization: Secondary | ICD-10-CM | POA: Diagnosis not present

## 2018-05-14 ENCOUNTER — Other Ambulatory Visit: Payer: Self-pay | Admitting: Obstetrics and Gynecology

## 2018-05-14 DIAGNOSIS — Z1231 Encounter for screening mammogram for malignant neoplasm of breast: Secondary | ICD-10-CM

## 2018-05-30 DIAGNOSIS — M25552 Pain in left hip: Secondary | ICD-10-CM | POA: Diagnosis not present

## 2018-06-13 ENCOUNTER — Ambulatory Visit
Admission: RE | Admit: 2018-06-13 | Discharge: 2018-06-13 | Disposition: A | Discharge status: Home / Self Care | Payer: 59 | Source: Ambulatory Visit | Attending: Obstetrics and Gynecology | Admitting: Obstetrics and Gynecology

## 2018-06-13 DIAGNOSIS — Z1231 Encounter for screening mammogram for malignant neoplasm of breast: Secondary | ICD-10-CM | POA: Diagnosis not present

## 2018-07-11 DIAGNOSIS — M25552 Pain in left hip: Secondary | ICD-10-CM | POA: Diagnosis not present

## 2018-07-18 DIAGNOSIS — L400 Psoriasis vulgaris: Secondary | ICD-10-CM | POA: Diagnosis not present

## 2018-07-20 DIAGNOSIS — M25552 Pain in left hip: Secondary | ICD-10-CM | POA: Diagnosis not present

## 2018-07-31 ENCOUNTER — Other Ambulatory Visit: Payer: Self-pay | Admitting: Family Medicine

## 2018-08-16 DIAGNOSIS — M25552 Pain in left hip: Secondary | ICD-10-CM | POA: Diagnosis not present

## 2018-08-16 DIAGNOSIS — M24159 Other articular cartilage disorders, unspecified hip: Secondary | ICD-10-CM | POA: Diagnosis not present

## 2018-09-13 DIAGNOSIS — M25552 Pain in left hip: Secondary | ICD-10-CM | POA: Diagnosis not present

## 2018-10-18 DIAGNOSIS — M1612 Unilateral primary osteoarthritis, left hip: Secondary | ICD-10-CM | POA: Diagnosis not present

## 2018-10-18 DIAGNOSIS — M25552 Pain in left hip: Secondary | ICD-10-CM | POA: Diagnosis not present

## 2018-10-18 DIAGNOSIS — M7062 Trochanteric bursitis, left hip: Secondary | ICD-10-CM | POA: Diagnosis not present

## 2018-11-08 ENCOUNTER — Other Ambulatory Visit: Payer: 59

## 2018-11-08 ENCOUNTER — Other Ambulatory Visit: Payer: Self-pay | Admitting: Family Medicine

## 2018-11-08 ENCOUNTER — Telehealth: Payer: Self-pay | Admitting: Family Medicine

## 2018-11-08 DIAGNOSIS — R7303 Prediabetes: Secondary | ICD-10-CM

## 2018-11-08 DIAGNOSIS — E7800 Pure hypercholesterolemia, unspecified: Secondary | ICD-10-CM

## 2018-11-08 NOTE — Telephone Encounter (Signed)
-----   Message from Ellamae Sia sent at 10/29/2018  3:27 PM EST ----- Regarding: Lab orders for Fridday, 3.6.20 Patient is scheduled for CPX labs, please order future labs, Thanks , Karna Christmas

## 2018-11-09 ENCOUNTER — Other Ambulatory Visit (INDEPENDENT_AMBULATORY_CARE_PROVIDER_SITE_OTHER): Payer: 59

## 2018-11-09 DIAGNOSIS — E7800 Pure hypercholesterolemia, unspecified: Secondary | ICD-10-CM | POA: Diagnosis not present

## 2018-11-09 DIAGNOSIS — R7303 Prediabetes: Secondary | ICD-10-CM | POA: Diagnosis not present

## 2018-11-09 LAB — COMPREHENSIVE METABOLIC PANEL
ALT: 15 U/L (ref 0–35)
AST: 14 U/L (ref 0–37)
Albumin: 4.2 g/dL (ref 3.5–5.2)
Alkaline Phosphatase: 51 U/L (ref 39–117)
BUN: 21 mg/dL (ref 6–23)
CO2: 29 mEq/L (ref 19–32)
Calcium: 9.5 mg/dL (ref 8.4–10.5)
Chloride: 105 mEq/L (ref 96–112)
Creatinine, Ser: 0.81 mg/dL (ref 0.40–1.20)
GFR: 72.08 mL/min (ref >60.00)
Glucose, Bld: 91 mg/dL (ref 70–99)
Potassium: 4.4 mEq/L (ref 3.5–5.1)
Sodium: 141 mEq/L (ref 135–145)
Total Bilirubin: 0.6 mg/dL (ref 0.2–1.2)
Total Protein: 7.1 g/dL (ref 6.0–8.3)

## 2018-11-09 LAB — LIPID PANEL
CHOLESTEROL: 156 mg/dL (ref 0–200)
HDL: 60.2 mg/dL (ref >39.00)
LDL CALC: 76 mg/dL (ref 0–99)
NONHDL: 96.22
Total CHOL/HDL Ratio: 3
Triglycerides: 101 mg/dL (ref 0.0–149.0)
VLDL: 20.2 mg/dL (ref 0.0–40.0)

## 2018-11-09 LAB — HEMOGLOBIN A1C: Hgb A1c MFr Bld: 5.9 % (ref 4.6–6.5)

## 2018-11-13 ENCOUNTER — Ambulatory Visit (INDEPENDENT_AMBULATORY_CARE_PROVIDER_SITE_OTHER): Payer: 59 | Admitting: Family Medicine

## 2018-11-13 ENCOUNTER — Encounter: Payer: Self-pay | Admitting: Family Medicine

## 2018-11-13 VITALS — BP 128/88 | HR 69 | Resp 12 | Ht 63.0 in | Wt 205.0 lb

## 2018-11-13 DIAGNOSIS — R7303 Prediabetes: Secondary | ICD-10-CM

## 2018-11-13 DIAGNOSIS — J302 Other seasonal allergic rhinitis: Secondary | ICD-10-CM | POA: Diagnosis not present

## 2018-11-13 DIAGNOSIS — Z0000 Encounter for general adult medical examination without abnormal findings: Secondary | ICD-10-CM | POA: Diagnosis not present

## 2018-11-13 DIAGNOSIS — E7800 Pure hypercholesterolemia, unspecified: Secondary | ICD-10-CM

## 2018-11-13 MED ORDER — MONTELUKAST SODIUM 10 MG PO TABS: 10.0000 mg | ORAL_TABLET | Freq: Every day | ORAL | 3 refills | Status: DC

## 2018-11-13 NOTE — Assessment & Plan Note (Signed)
Well controlled. Continue current medication.  

## 2018-11-13 NOTE — Assessment & Plan Note (Signed)
Add singulair to xyzal and flonase.

## 2018-11-13 NOTE — Assessment & Plan Note (Signed)
Stable control.  Lab Results  Component Value Date   HGBA1C 5.9 11/09/2018

## 2018-11-13 NOTE — Progress Notes (Signed)
Subjective:    Patient ID: Veronica Hawkins, female    DOB: 23-Apr-1961, 58 y.o.   MRN: [id number]  HPI  The patient is here for annual wellness exam and preventative care.     Leg cramping at nigh ongoing for a whilet..  Muscle relaxant given by Dr. Elmyra Ricks.   She has been having sinus congestion, pressure in last 3 months. No ear pain, clear discharge in AMs, no itching, occ sneeze. No cough, no SOB, no wheeze.  no fever, no chills.     Using flonase and zyrtec.  Elevated Cholesterol:  LDL at goal on simvastatin 40 mg daily Lab Results  Component Value Date   CHOL 156 11/09/2018   HDL 60.20 11/09/2018   LDLCALC 76 11/09/2018   LDLDIRECT 173.2 03/19/2007   TRIG 101.0 11/09/2018   CHOLHDL 3 11/09/2018  Using medications without problems: Muscle aches:  Diet compliance: healthy Exercise: elliptical 2-3 times week. Other complaints:   BP borderline elevated in office today.  She is on methorexate for eczema/ psoriasis..     BP at home and other MD in nml range.  On recheck in nml range BP Readings from Last 3 Encounters:  11/13/18 (!) 148/80  11/09/17 120/80  11/03/17 122/64     Social History /Family History/Past Medical History reviewed in detail and updated in EMR if needed. Blood pressure (!) 148/80, pulse 69, resp. rate 12, height 5\' 3"  (1.6 m), weight 205 lb (93 kg), SpO2 96 %.  Review of Systems  Constitutional: Negative for fatigue and fever.  HENT: Negative for congestion.   Eyes: Negative for pain.  Respiratory: Negative for cough and shortness of breath.   Cardiovascular: Negative for chest pain, palpitations and leg swelling.  Gastrointestinal: Negative for abdominal pain.  Genitourinary: Negative for dysuria and vaginal bleeding.  Musculoskeletal: Negative for back pain.  Neurological: Negative for syncope, light-headedness and headaches.  Psychiatric/Behavioral: Negative for dysphoric mood.       Objective:   Physical Exam Constitutional:     General: She is not in acute distress.    Appearance: Normal appearance. She is well-developed. She is not ill-appearing or toxic-appearing.  HENT:     Head: Normocephalic.     Right Ear: Hearing, tympanic membrane, ear canal and external ear normal.     Left Ear: Hearing, tympanic membrane, ear canal and external ear normal.     Nose: Nose normal.  Eyes:     General: Lids are normal. Lids are everted, no foreign bodies appreciated.     Conjunctiva/sclera: Conjunctivae normal.     Pupils: Pupils are equal, round, and reactive to light.  Neck:     Musculoskeletal: Normal range of motion and neck supple.     Thyroid: No thyroid mass or thyromegaly.     Vascular: No carotid bruit.     Trachea: Trachea normal.  Cardiovascular:     Rate and Rhythm: Normal rate and regular rhythm.     Heart sounds: Normal heart sounds, S1 normal and S2 normal. No murmur. No gallop.   Pulmonary:     Effort: Pulmonary effort is normal. No respiratory distress.     Breath sounds: Normal breath sounds. No wheezing, rhonchi or rales.  Abdominal:     General: Bowel sounds are normal. There is no distension or abdominal bruit.     Palpations: Abdomen is soft. There is no fluid wave or mass.     Tenderness: There is no abdominal tenderness. There is no guarding or  rebound.     Hernia: No hernia is present.  Lymphadenopathy:     Cervical: No cervical adenopathy.  Skin:    General: Skin is warm and dry.     Findings: No rash.  Neurological:     Mental Status: She is alert.     Cranial Nerves: No cranial nerve deficit.     Sensory: No sensory deficit.  Psychiatric:        Mood and Affect: Mood is not anxious or depressed.        Speech: Speech normal.        Behavior: Behavior normal. Behavior is cooperative.        Judgment: Judgment normal.           Assessment & Plan:  The patient's preventative maintenance and recommended screening tests for an annual wellness exam were reviewed in full  today. Brought up to date unless services declined.  Counselled on the importance of diet, exercise, and its role in overall health and mortality. The patient's FH and SH was reviewed, including their home life, tobacco status, and drug and alcohol status.   Vaccines:uptodate td and flu Pap/DVE:Partial hysterectomy.. Followed by GYN every other year. Mammo:06/2018 normal Bone Density:Nml years ago. Plan repeat age 49. Low risk Colon:nml 01/2011, recommnded repeat in 10 years, Dr. Fuller Plan. Smoking Status:former,25 pack year history. Asymptomatic, nml spirometry, last CXR 2013 nml.. QUIT 2011 ETOH/ drug use: rare/none Hep C:done HIV screen:refused

## 2018-11-13 NOTE — Patient Instructions (Addendum)
Stop zyrtec and change to Xyzal daily. Continue fluticasone nasal.  If not better can use singulair at bedtime.

## 2018-11-20 DIAGNOSIS — Z1389 Encounter for screening for other disorder: Secondary | ICD-10-CM | POA: Diagnosis not present

## 2018-11-20 DIAGNOSIS — Z130 Encounter for screening for diseases of the blood and blood-forming organs and certain disorders involving the immune mechanism: Secondary | ICD-10-CM | POA: Diagnosis not present

## 2018-11-20 DIAGNOSIS — Z01419 Encounter for gynecological examination (general) (routine) without abnormal findings: Secondary | ICD-10-CM | POA: Diagnosis not present

## 2018-12-02 ENCOUNTER — Other Ambulatory Visit: Payer: Self-pay | Admitting: Family Medicine

## 2018-12-05 ENCOUNTER — Other Ambulatory Visit: Payer: Self-pay | Admitting: Family Medicine

## 2019-02-04 ENCOUNTER — Other Ambulatory Visit: Payer: Self-pay | Admitting: Family Medicine

## 2019-02-27 ENCOUNTER — Other Ambulatory Visit: Payer: Self-pay | Admitting: Family Medicine

## 2019-04-18 ENCOUNTER — Encounter: Payer: Self-pay | Admitting: Podiatry

## 2019-04-18 ENCOUNTER — Other Ambulatory Visit: Payer: Self-pay

## 2019-04-18 ENCOUNTER — Ambulatory Visit (INDEPENDENT_AMBULATORY_CARE_PROVIDER_SITE_OTHER): Payer: 59

## 2019-04-18 ENCOUNTER — Ambulatory Visit: Payer: 59 | Admitting: Podiatry

## 2019-04-18 ENCOUNTER — Other Ambulatory Visit: Payer: Self-pay | Admitting: Podiatry

## 2019-04-18 VITALS — Temp 97.3°F

## 2019-04-18 DIAGNOSIS — M722 Plantar fascial fibromatosis: Secondary | ICD-10-CM | POA: Diagnosis not present

## 2019-04-18 DIAGNOSIS — M79671 Pain in right foot: Secondary | ICD-10-CM

## 2019-04-18 NOTE — Progress Notes (Signed)
Subjective:   Patient ID: Veronica Hawkins, female   DOB: 58 y.o.   MRN: [id number]   HPI Patient states she is developed a lot of pain in the bottom of the right heel and it was doing well and then all of a sudden started around a month ago   ROS      Objective:  Physical Exam  Neurovascular status intact with exquisite discomfort plantar heel right at the insertion tendon calcaneus     Assessment:  Acute plantar fasciitis right with inflammation fluid     Plan:  H&P reviewed condition x-ray and today did sterile prep and injected the fascia 3 mg Kenalog 5 mg Xylocaine and advised on supportive shoes and stretching exercises.  Reappoint as needed  X-ray indicates small spur no indications of stress fracture arthritis

## 2019-05-02 ENCOUNTER — Encounter: Payer: Self-pay | Admitting: Podiatry

## 2019-05-02 ENCOUNTER — Ambulatory Visit: Payer: 59 | Admitting: Podiatry

## 2019-05-02 ENCOUNTER — Other Ambulatory Visit: Payer: Self-pay

## 2019-05-02 DIAGNOSIS — M21619 Bunion of unspecified foot: Secondary | ICD-10-CM | POA: Diagnosis not present

## 2019-05-02 DIAGNOSIS — M722 Plantar fascial fibromatosis: Secondary | ICD-10-CM

## 2019-05-03 NOTE — Progress Notes (Signed)
Subjective:   Patient ID: Veronica Hawkins, female   DOB: 58 y.o.   MRN: [id number]   HPI Patient presents stating his heel is still really bothering me and also I know I am getting need to have the bunion fixed on my left foot.  Patient states she wants to get a little further along with the heel first and would like to do this in the fall   ROS      Objective:  Physical Exam  Neurovascular status intact with inflammation pain of the right plantar fascia at the insertional point tendon calcaneus but still quite intense with pain radiating into the mid arch area which is probably compensation with bunion deformity noted left with redness around the first metatarsal head that is sore with palpation with no range of motion loss     Assessment:  Significant plantar fascial symptomatology right with inflammation fluid buildup along with structural bunion deformity left     Plan:  H&P conditions reviewed and today I did sterile prep and injected the right plantar fascia 3 mg Kenalog 5 mg Xylocaine and applied air fracture walker to completely immobilize and let rest occur.  Explained how to use it for the next 3 weeks and then discussed the bunion and treatment options with distal osteotomy recommended which will be discussed at next visit

## 2019-05-14 ENCOUNTER — Other Ambulatory Visit: Payer: Self-pay | Admitting: Obstetrics and Gynecology

## 2019-05-14 DIAGNOSIS — Z1231 Encounter for screening mammogram for malignant neoplasm of breast: Secondary | ICD-10-CM

## 2019-05-23 ENCOUNTER — Ambulatory Visit: Payer: 59 | Admitting: Podiatry

## 2019-05-29 ENCOUNTER — Other Ambulatory Visit: Payer: Self-pay | Admitting: Emergency Medicine

## 2019-05-29 DIAGNOSIS — Z20822 Contact with and (suspected) exposure to covid-19: Secondary | ICD-10-CM

## 2019-05-31 LAB — NOVEL CORONAVIRUS, NAA: SARS-CoV-2, NAA: NOT DETECTED

## 2019-06-05 ENCOUNTER — Other Ambulatory Visit: Payer: Self-pay

## 2019-06-05 ENCOUNTER — Other Ambulatory Visit: Payer: Self-pay | Admitting: Podiatry

## 2019-06-05 ENCOUNTER — Ambulatory Visit (INDEPENDENT_AMBULATORY_CARE_PROVIDER_SITE_OTHER): Payer: 59

## 2019-06-05 ENCOUNTER — Ambulatory Visit: Payer: 59 | Admitting: Podiatry

## 2019-06-05 ENCOUNTER — Encounter: Payer: Self-pay | Admitting: Podiatry

## 2019-06-05 DIAGNOSIS — M21619 Bunion of unspecified foot: Secondary | ICD-10-CM

## 2019-06-05 DIAGNOSIS — M79672 Pain in left foot: Secondary | ICD-10-CM | POA: Diagnosis not present

## 2019-06-05 DIAGNOSIS — M2012 Hallux valgus (acquired), left foot: Secondary | ICD-10-CM

## 2019-06-05 NOTE — Progress Notes (Signed)
Subjective:   Patient ID: Veronica Hawkins, female   DOB: 58 y.o.   MRN: [id number]   HPI Patient states the right heel is feeling real good and the left bunion is bothering her more and she would like to get it corrected due to the pain.  Patient states that she has tried wider shoes and other modalities without relief of symptoms   ROS      Objective:  Physical Exam  Neuro vascular status found to be intact with patient's right heel feeling much better with mild discomfort upon deep palpation with bunion deformity left with redness and pain with palpation     Assessment:  Doing well plantar fasciitis right with structural bunion deformity left     Plan:  H&P reviewed both conditions and at this point for the heel have recommended supportive therapy anti-inflammatories and for the left I do think bunion correction is recommended.  Patient wants surgery and I allowed her to read consent form going over alternative treatments complications associated with this type of procedure and patient understands procedure and risk and is willing to accept and at this point signed consent form after extensive review.  Patient understands no guarantees as far as success of surgery and wants to have this performed and is willing to accept risk and understands total recovery can take 6 months to 1 year and will be wearing the air fracture walker for the first several weeks after surgery

## 2019-06-05 NOTE — Patient Instructions (Signed)
Pre-Operative Instructions  Congratulations, you have decided to take an important step towards improving your quality of life.  You can be assured that the doctors and staff at [facility] will be with you every step of the way.  Here are some important things you should know:  1. Plan to be at the surgery center/hospital at least 1 (one) hour prior to your scheduled time, unless otherwise directed by the surgical center/hospital staff.  You must have a responsible adult accompany you, remain during the surgery and drive you home.  Make sure you have directions to the surgical center/hospital to ensure you arrive on time. 2. If you are having surgery at [facility] or [facility] hospitals, you will need a copy of your medical history and physical form from your family physician within one month prior to the date of surgery. We will give you a form for your primary physician to complete.  3. We make every effort to accommodate the date you request for surgery.  However, there are times where surgery dates or times have to be moved.  We will contact you as soon as possible if a change in schedule is required.   4. No aspirin/ibuprofen for one week before surgery.  If you are on aspirin, any non-steroidal anti-inflammatory medications (Mobic, Aleve, Ibuprofen) should not be taken seven (7) days prior to your surgery.  You make take Tylenol for pain prior to surgery.  5. Medications - If you are taking daily heart and blood pressure medications, seizure, reflux, allergy, asthma, anxiety, pain or diabetes medications, make sure you notify the surgery center/hospital before the day of surgery so they can tell you which medications you should take or avoid the day of surgery. 6. No food or drink after midnight the night before surgery unless directed otherwise by surgical center/hospital staff. 7. No alcoholic beverages 24-hours prior to surgery.  No smoking 24-hours prior or 24-hours after  surgery. 8. Wear loose pants or shorts. They should be loose enough to fit over bandages, boots, and casts. 9. Don't wear slip-on shoes. Sneakers are preferred. 10. Bring your boot with you to the surgery center/hospital.  Also bring crutches or a walker if your physician has prescribed it for you.  If you do not have this equipment, it will be provided for you after surgery. 11. If you have not been contacted by the surgery center/hospital by the day before your surgery, call to confirm the date and time of your surgery. 12. Leave-time from work may vary depending on the type of surgery you have.  Appropriate arrangements should be made prior to surgery with your employer. 13. Prescriptions will be provided immediately following surgery by your doctor.  Fill these as soon as possible after surgery and take the medication as directed. Pain medications will not be refilled on weekends and must be approved by the doctor. 14. Remove nail polish on the operative foot and avoid getting pedicures prior to surgery. 15. Wash the night before surgery.  The night before surgery wash the foot and leg well with water and the antibacterial soap provided. Be sure to pay special attention to beneath the toenails and in between the toes.  Wash for at least three (3) minutes. Rinse thoroughly with water and dry well with a towel.  Perform this wash unless told not to do so by your physician.  Enclosed: 1 Ice pack (please put in freezer the night before surgery)   1 Hibiclens skin cleaner     Pre-op instructions  If you have any questions regarding the instructions, please do not hesitate to call our office.  [place]: [address], [place], [place] [address] -- [phone]  [place]: [address] [place], [place] [address] -- [phone]  [place]: [address]  [place], [place] [address] -- [phone]   Website: [email or web address] 

## 2019-06-10 ENCOUNTER — Telehealth: Payer: Self-pay | Admitting: *Deleted

## 2019-06-10 NOTE — Telephone Encounter (Signed)
Called and spoke with Veronica Hawkins from [facility] and representative stated that the procedure code 28296 does require prior authorzation and the reference number is [id number] and gave me a phone number to call in order to get authorization and that number is [phone] and I called and spoke with Veronica Hawkins, reference number is [id number] and the representative stated that we would have to fax clinical notes to the clinical review at [phone] and could take up to 5 to 15 calendar days to get a prior authorization and the case number is [id number] and I faxed over the clinical notes today. Lattie Haw

## 2019-06-12 ENCOUNTER — Telehealth: Payer: Self-pay | Admitting: *Deleted

## 2019-06-12 NOTE — Telephone Encounter (Signed)
"  I am calling to speak with Demetrio Lapping in regards to a request for authorization for [employer].  The request has been approved.  The approval number is  [id number]."  I'll let her know.  "Thank you so much.  Tell her to give me a call if she has any questions."

## 2019-06-25 ENCOUNTER — Ambulatory Visit: Payer: 59

## 2019-06-25 ENCOUNTER — Encounter: Payer: Self-pay | Admitting: Podiatry

## 2019-06-25 DIAGNOSIS — M2012 Hallux valgus (acquired), left foot: Secondary | ICD-10-CM

## 2019-06-28 ENCOUNTER — Telehealth: Payer: Self-pay | Admitting: *Deleted

## 2019-06-28 IMAGING — MG DIGITAL SCREENING BILATERAL MAMMOGRAM WITH TOMO AND CAD
8 series · 8 of 24 positions shown · non-contrast
Comparison: Previous exam(s).

CLINICAL DATA: Screening.

EXAM:
DIGITAL SCREENING BILATERAL MAMMOGRAM WITH TOMO AND CAD

[R MLO synth-2D]
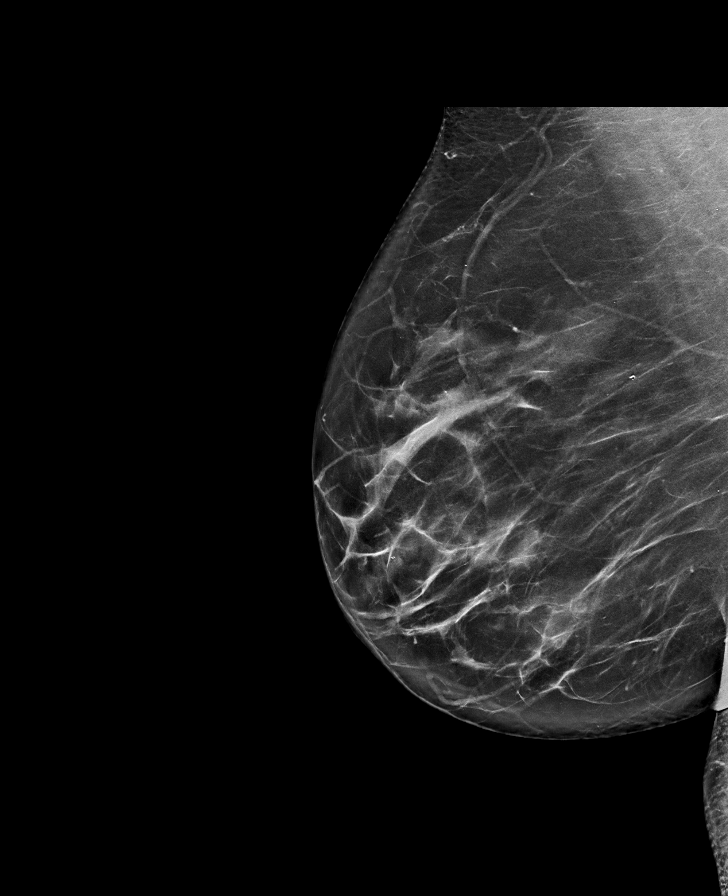

[R CC synth-2D]
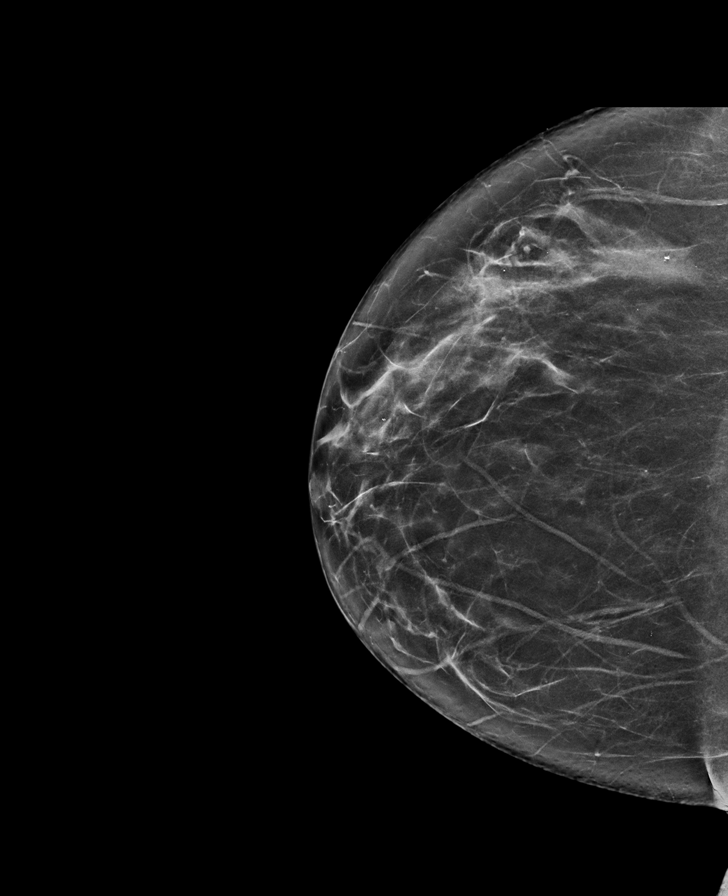

[L MLO synth-2D]
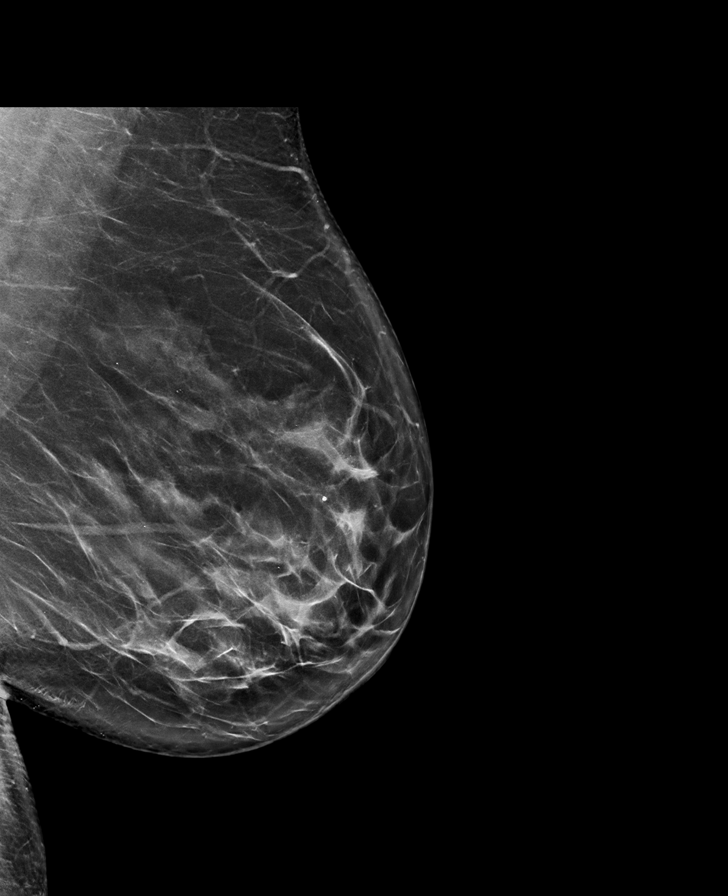

[L CC synth-2D]
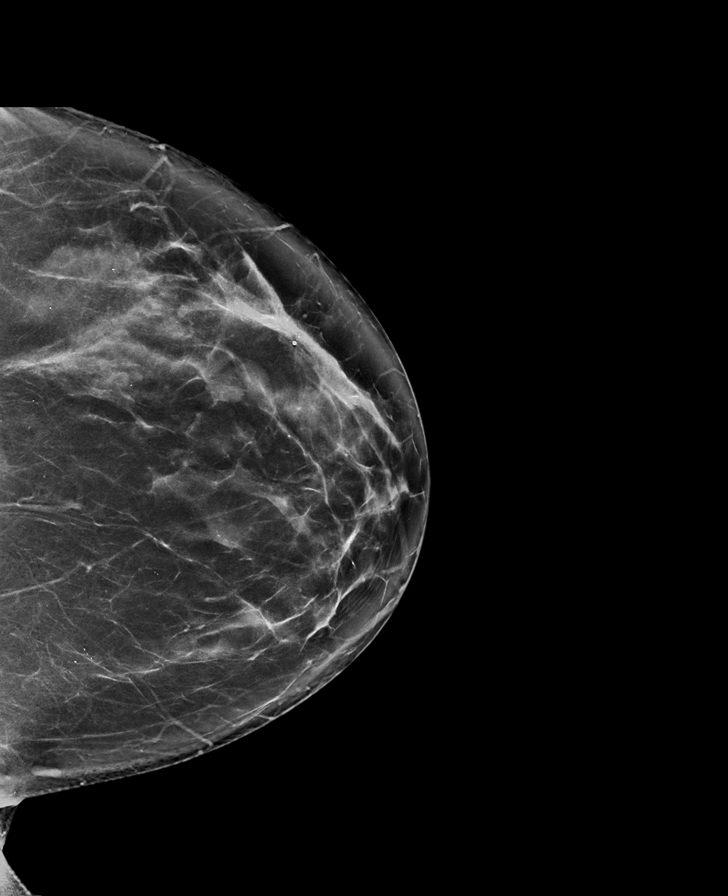

[R CC tomo · tomo slice 37/73.0]
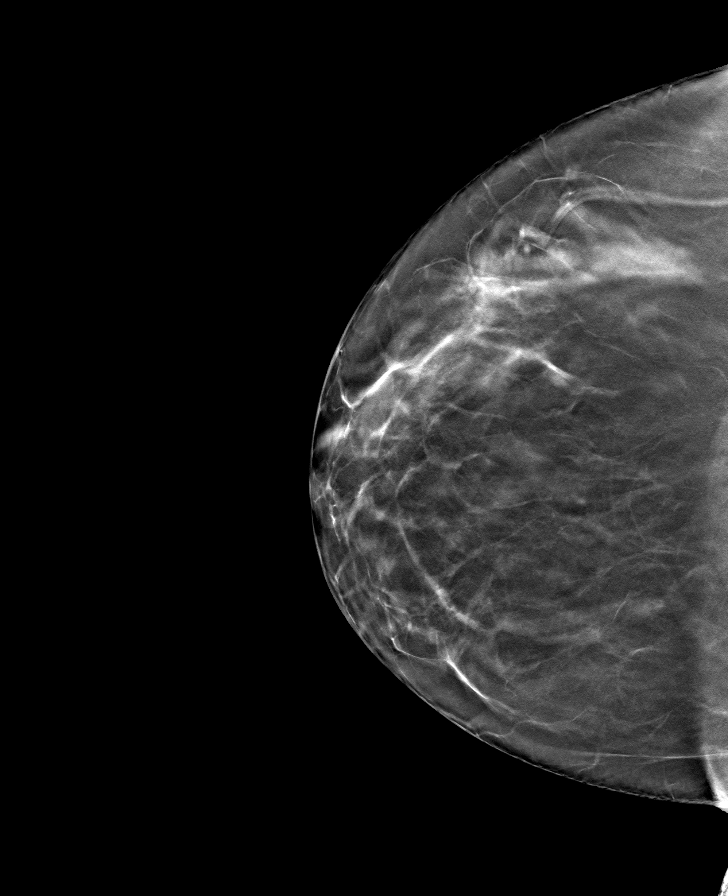

[R MLO tomo · tomo slice 42/83.0]
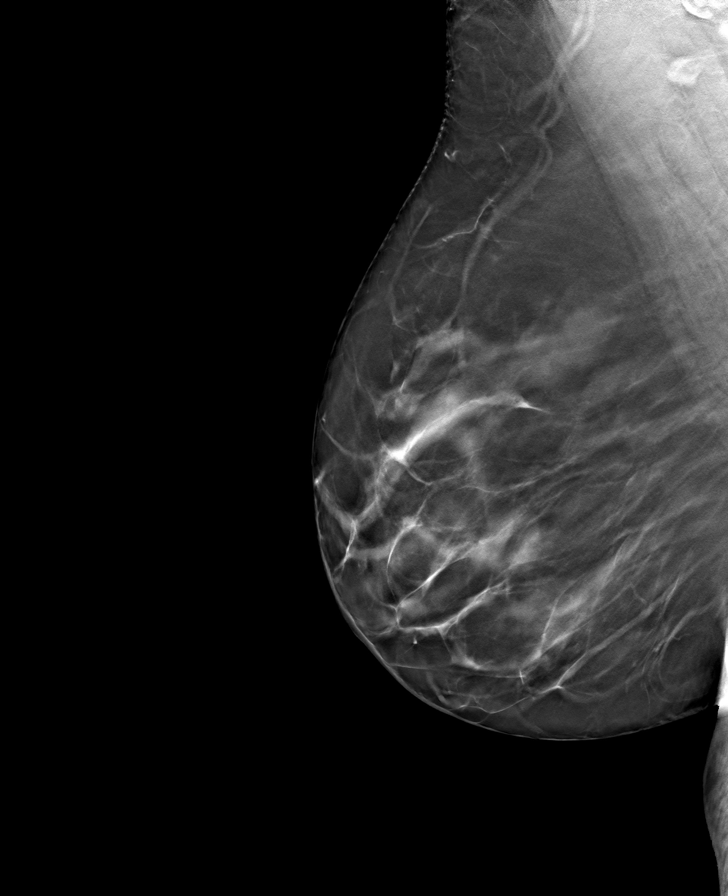

[L CC tomo · tomo slice 42/83.0]
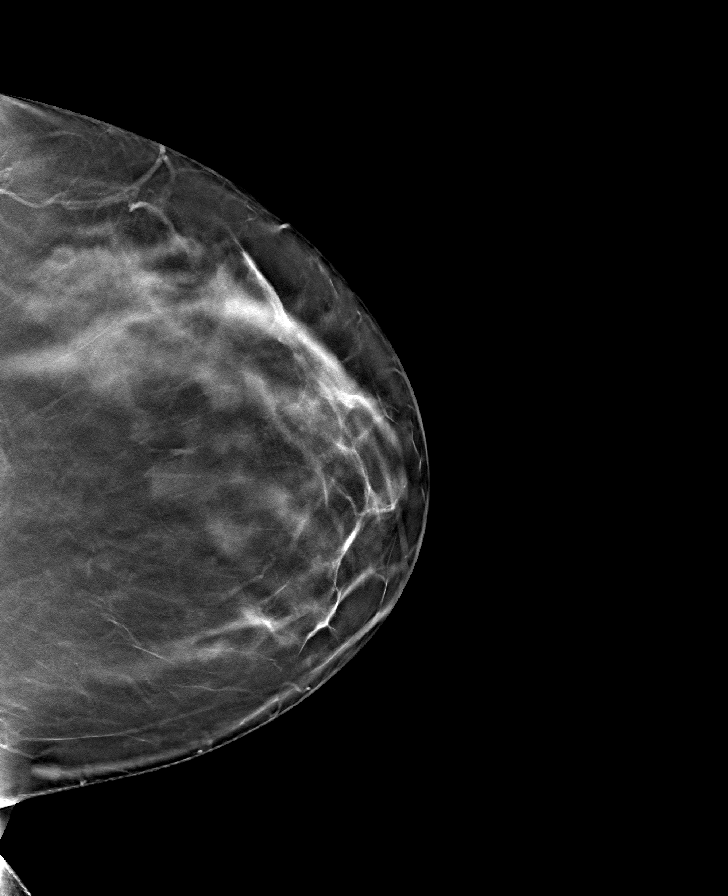

[L MLO tomo · tomo slice 44/87.0]
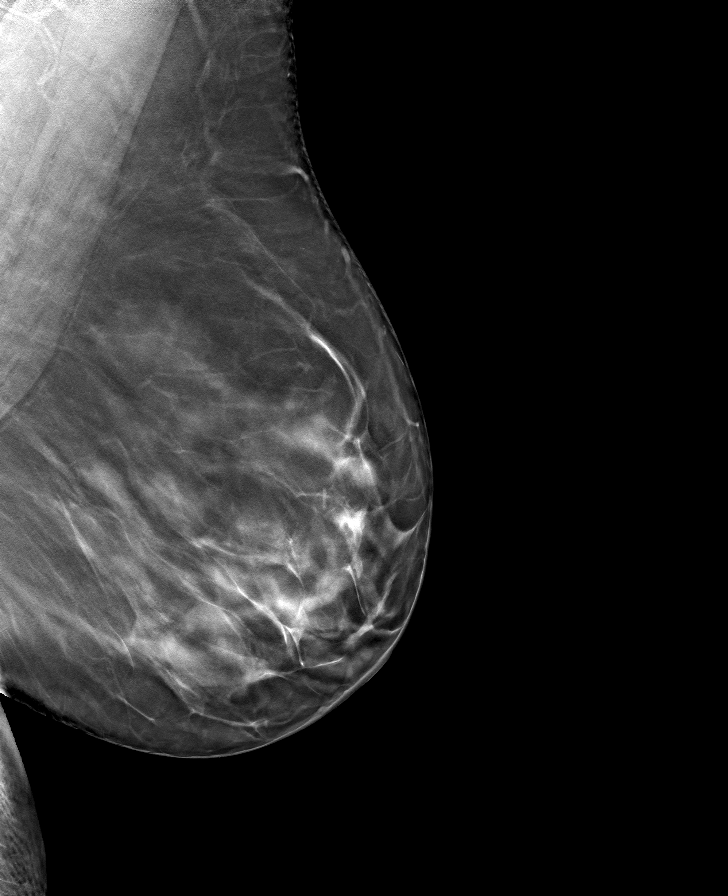

[8 of 24 positions shown; findings below may reference images not displayed]

ACR Breast Density Category b: There are scattered areas of
fibroglandular density.
FINDINGS: There are no findings suspicious for malignancy. Images were
processed with CAD.
IMPRESSION: No mammographic evidence of malignancy. A result letter of this
screening mammogram will be mailed directly to the patient.

RECOMMENDATION:
Screening mammogram in one year. (Code:[id number])

BI-RADS CATEGORY  1: Negative.

## 2019-06-28 NOTE — Telephone Encounter (Signed)
completed

## 2019-06-28 NOTE — Telephone Encounter (Signed)
Patient called and stated that the toe on her surgery foot (left) has been cramping for the last 2 days,please call. Returned to patient and informed per Jessica(nurse) to remove ace wrap bandage for 5 mins.,rewrap loose, ice and continue to elevate the foot,if any further problems, call back. Patient verbally understood and said ok.

## 2019-07-04 ENCOUNTER — Ambulatory Visit (INDEPENDENT_AMBULATORY_CARE_PROVIDER_SITE_OTHER): Payer: 59

## 2019-07-04 ENCOUNTER — Other Ambulatory Visit: Payer: Self-pay

## 2019-07-04 ENCOUNTER — Encounter: Payer: Self-pay | Admitting: Podiatry

## 2019-07-04 ENCOUNTER — Ambulatory Visit (INDEPENDENT_AMBULATORY_CARE_PROVIDER_SITE_OTHER): Payer: 59 | Admitting: Podiatry

## 2019-07-04 VITALS — BP 136/78 | HR 73 | Temp 96.9°F | Resp 16

## 2019-07-04 DIAGNOSIS — M21619 Bunion of unspecified foot: Secondary | ICD-10-CM

## 2019-07-04 DIAGNOSIS — Z09 Encounter for follow-up examination after completed treatment for conditions other than malignant neoplasm: Secondary | ICD-10-CM

## 2019-07-04 DIAGNOSIS — M21612 Bunion of left foot: Secondary | ICD-10-CM

## 2019-07-04 NOTE — Progress Notes (Signed)
Subjective:   Patient ID: Veronica Hawkins, female   DOB: 58 y.o.   MRN: [id number]   HPI Patient presents stating she is doing very well with minimal discomfort and is able to walk currently without significant pain   ROS      Objective:  Physical Exam  Neurovascular status intact negative Bevelyn Buckles' sign noted wound edges first MPJ healing well wound edges well coapted with good alignment of the hallux and good range of motion with no crepitus     Assessment:  Doing well post bunion surgery left with good alignment noted and range of motion adequate for this.  Postop     Plan:  Reviewed condition and x-ray and reapplied sterile dressing instructed on continued elevation compression immobilization and reappoint for routine visit 2 weeks or earlier if needed.  Dispensed surgical shoe for the beginning of usage but most boot usage still at this time  X-rays indicate that there is good healing of the osteotomy fixation in place joint congruence

## 2019-07-04 NOTE — Telephone Encounter (Signed)
Veronica Hawkins at front desk said that pt had scheduled a visit with Dr Diona Browner on 07/09/19 for dizziness thru my chart. I spoke with pt and she said that was first appt offered to pt by mychart. Pt said the dizziness is worse when she bends over and today she does not have tingling in her face. Pt scheduled in office appt on 07/05/19 at 9 AM; pt said on 06/30/19 pts daughter in law who is EMT took pts BP,P , O2  Sat and BS and all were good. Today pt was at podiatrist office and BP 136/78 P 73. Pt has no covid symptoms, no travel and no known exposure to + covid. ED precautions given and pt voiced understanding. FYI to Dr Diona Browner.

## 2019-07-05 ENCOUNTER — Ambulatory Visit (INDEPENDENT_AMBULATORY_CARE_PROVIDER_SITE_OTHER): Payer: 59 | Admitting: Family Medicine

## 2019-07-05 ENCOUNTER — Encounter: Payer: Self-pay | Admitting: Family Medicine

## 2019-07-05 VITALS — BP 120/80 | HR 72 | Temp 98.0°F | Ht 63.0 in | Wt 206.0 lb

## 2019-07-05 DIAGNOSIS — H8110 Benign paroxysmal vertigo, unspecified ear: Secondary | ICD-10-CM | POA: Diagnosis not present

## 2019-07-05 DIAGNOSIS — H6981 Other specified disorders of Eustachian tube, right ear: Secondary | ICD-10-CM

## 2019-07-05 NOTE — Assessment & Plan Note (Signed)
Treat with home desensitization exercises.  No red flags and nml neuro exam.

## 2019-07-05 NOTE — Assessment & Plan Note (Signed)
Continue flonase and antihistamine.. if not improving consider pred taper.

## 2019-07-05 NOTE — Progress Notes (Signed)
Chief Complaint  Patient presents with  . Dizziness    History of Present Illness: HPI  58 year old female pt with history of prediabetes and allergies presents with new onset dizziness.  She reports 6 weeks noted new onset staggering after weed eating yard looking down, then when looked up  No other issues.   Now  In last 5 days she had sudden dizziness/ vertigo when sat up from bed. Threw her down to the bed.  off and on unsteady through the last few days.  Movement of head triggers dizziness.   Mild neck pain, no head ache but feels discharge in eyes.  Some allergies.  Had 1 hour of tingling in right cheek... no weakness of muscles.   On flonase , singulair and xyzal  She is walking for CAm walker for bunion surgery.  COVID 19 screen No recent travel or known exposure to COVID19 The patient denies respiratory symptoms of COVID 19 at this time.  The importance of social distancing was discussed today.   Review of Systems  Constitutional: Negative for chills and fever.  HENT: Negative for congestion and ear pain.   Eyes: Negative for pain and redness.  Respiratory: Negative for cough and shortness of breath.   Cardiovascular: Negative for chest pain, palpitations and leg swelling.  Gastrointestinal: Negative for abdominal pain, blood in stool, constipation, diarrhea, nausea and vomiting.  Genitourinary: Negative for dysuria.  Musculoskeletal: Negative for falls and myalgias.  Skin: Negative for rash.  Neurological: Positive for dizziness and tingling. Negative for seizures, loss of consciousness and weakness.  Psychiatric/Behavioral: Negative for depression. The patient is not nervous/anxious.       Past Medical History:  Diagnosis Date  . Allergy    seasonal  . Arthritis   . Hyperlipidemia     reports that she quit smoking about 9 years ago. Her smoking use included cigarettes. She has never used smokeless tobacco. She reports that she does not drink alcohol or use  drugs.   Current Outpatient Medications:  .  aspirin 81 MG tablet, Take 81 mg by mouth daily., Disp: , Rfl:  .  Cholecalciferol (VITAMIN D3) 10000 units TABS, Take 1 tablet by mouth daily., Disp: , Rfl:  .  clobetasol ointment (TEMOVATE) 0.05 %, APPLY TO AFFECTED AREA ON THE SKIN TWICE A DAY AS NEEDED, Disp: , Rfl: 3 .  loratadine (CLARITIN) 10 MG tablet, Take 10 mg by mouth daily., Disp: , Rfl:  .  Melatonin 10 MG TABS, Take 1 tablet by mouth daily., Disp: , Rfl:  .  meloxicam (MOBIC) 15 MG tablet, TAKE 1 TABLET BY MOUTH EVERY DAY WITH A MEAL, Disp: , Rfl:  .  methocarbamol (ROBAXIN) 500 MG tablet, Take 1 tablet by mouth at bedtime as needed., Disp: , Rfl:  .  montelukast (SINGULAIR) 10 MG tablet, TAKE 1 TABLET BY MOUTH EVERYDAY AT BEDTIME, Disp: 90 tablet, Rfl: 3 .  ondansetron (ZOFRAN) 4 MG tablet, Take 4 mg by mouth every 8 (eight) hours as needed for nausea or vomiting. Dispensed 15 with no refills on 06/25/19, Disp: , Rfl:  .  oxyCODONE-acetaminophen (PERCOCET) 10-325 MG tablet, Take 1 tablet by mouth every 4 (four) hours as needed for pain. Dispensed 25 with no refills on 06/25/19, Disp: , Rfl:  .  simvastatin (ZOCOR) 40 MG tablet, TAKE 1 TABLET (40 MG TOTAL) BY MOUTH DAILY., Disp: 90 tablet, Rfl: 3 .  traZODone (DESYREL) 50 MG tablet, TAKE 2 TABLETS BY MOUTH AT BEDTIME AS NEEDED  FOR SLEEP, Disp: 180 tablet, Rfl: 1   Observations/Objective: Blood pressure 120/80, pulse 72, temperature 98 F (36.7 C), temperature source Temporal, height 5\' 3"  (1.6 m), weight 206 lb (93.4 kg), SpO2 97 %.  Physical Exam Constitutional:      General: She is not in acute distress.    Appearance: Normal appearance. She is well-developed. She is not ill-appearing or toxic-appearing.  HENT:     Head: Normocephalic.     Right Ear: Hearing, ear canal and external ear normal. A middle ear effusion is present. Tympanic membrane is not erythematous, retracted or bulging.     Left Ear: Hearing, tympanic membrane,  ear canal and external ear normal. Tympanic membrane is not erythematous, retracted or bulging.     Nose: No mucosal edema or rhinorrhea.     Right Sinus: No maxillary sinus tenderness or frontal sinus tenderness.     Left Sinus: No maxillary sinus tenderness or frontal sinus tenderness.     Mouth/Throat:     Pharynx: Uvula midline.  Eyes:     General: Lids are normal. Lids are everted, no foreign bodies appreciated.     Conjunctiva/sclera: Conjunctivae normal.     Pupils: Pupils are equal, round, and reactive to light.  Neck:     Musculoskeletal: Normal range of motion and neck supple.     Thyroid: No thyroid mass or thyromegaly.     Vascular: No carotid bruit.     Trachea: Trachea normal.  Cardiovascular:     Rate and Rhythm: Normal rate and regular rhythm.     Pulses: Normal pulses.     Heart sounds: Normal heart sounds, S1 normal and S2 normal. No murmur. No friction rub. No gallop.   Pulmonary:     Effort: Pulmonary effort is normal. No tachypnea or respiratory distress.     Breath sounds: Normal breath sounds. No decreased breath sounds, wheezing, rhonchi or rales.  Abdominal:     General: Bowel sounds are normal.     Palpations: Abdomen is soft.     Tenderness: There is no abdominal tenderness.  Skin:    General: Skin is warm and dry.     Findings: No rash.  Neurological:     Mental Status: She is alert and oriented to person, place, and time.     GCS: GCS eye subscore is 4. GCS verbal subscore is 5. GCS motor subscore is 6.     Cranial Nerves: No cranial nerve deficit.     Sensory: No sensory deficit.     Motor: No abnormal muscle tone.     Coordination: Coordination normal.     Gait: Gait normal.     Deep Tendon Reflexes: Reflexes are normal and symmetric.     Comments: Nml cerebellar exam   No papilledema  Psychiatric:        Mood and Affect: Mood is not anxious or depressed.        Speech: Speech normal.        Behavior: Behavior normal. Behavior is cooperative.         Thought Content: Thought content normal.        Cognition and Memory: Memory is not impaired. She does not exhibit impaired recent memory or impaired remote memory.        Judgment: Judgment normal.      Assessment and Plan   Benign paroxysmal positional vertigo Treat with home desensitization exercises.  No red flags and nml neuro exam.  ETD (Eustachian tube dysfunction), right  Continue flonase and antihistamine.. if not improving consider pred taper.     Eliezer Lofts, MD

## 2019-07-05 NOTE — Patient Instructions (Signed)
Start desensitization exercises.  Can use meclizine for severe vertigos.  Call if fullness in right ear is not improving for possible course of prednisone. Continue flonase 2 sprays per nostril daily and allergy meds.  Benign Positional Vertigo Vertigo is the feeling that you or your surroundings are moving when they are not. Benign positional vertigo is the most common form of vertigo. This is usually a harmless condition (benign). This condition is positional. This means that symptoms are triggered by certain movements and positions. This condition can be dangerous if it occurs while you are doing something that could cause harm to you or others. This includes activities such as driving or operating machinery. What are the causes? In many cases, the cause of this condition is not known. It may be caused by a disturbance in an area of the inner ear that helps your brain to sense movement and balance. This disturbance can be caused by:  Viral infection (labyrinthitis).  Head injury.  Repetitive motion, such as jumping, dancing, or running. What increases the risk? You are more likely to develop this condition if:  You are a woman.  You are 27 years of age or older. What are the signs or symptoms? Symptoms of this condition usually happen when you move your head or your eyes in different directions. Symptoms may start suddenly, and usually last for less than a minute. They include:  Loss of balance and falling.  Feeling like you are spinning or moving.  Feeling like your surroundings are spinning or moving.  Nausea and vomiting.  Blurred vision.  Dizziness.  Involuntary eye movement (nystagmus). Symptoms can be mild and cause only minor problems, or they can be severe and interfere with daily life. Episodes of benign positional vertigo may return (recur) over time. Symptoms may improve over time. How is this diagnosed? This condition may be diagnosed based on:  Your medical  history.  Physical exam of the head, neck, and ears.  Tests, such as: ? MRI. ? CT scan. ? Eye movement tests. Your health care provider may ask you to change positions quickly while he or she watches you for symptoms of benign positional vertigo, such as nystagmus. Eye movement may be tested with a variety of exams that are designed to evaluate or stimulate vertigo. ? An electroencephalogram (EEG). This records electrical activity in your brain. ? Hearing tests. You may be referred to a health care provider who specializes in ear, nose, and throat (ENT) problems (otolaryngologist) or a provider who specializes in disorders of the nervous system (neurologist). How is this treated?  This condition may be treated in a session in which your health care provider moves your head in specific positions to adjust your inner ear back to normal. Treatment for this condition may take several sessions. Surgery may be needed in severe cases, but this is rare. In some cases, benign positional vertigo may resolve on its own in 2-4 weeks. Follow these instructions at home: Safety  Move slowly. Avoid sudden body or head movements or certain positions, as told by your health care provider.  Avoid driving until your health care provider says it is safe for you to do so.  Avoid operating heavy machinery until your health care provider says it is safe for you to do so.  Avoid doing any tasks that would be dangerous to you or others if vertigo occurs.  If you have trouble walking or keeping your balance, try using a cane for stability. If you feel  dizzy or unstable, sit down right away.  Return to your normal activities as told by your health care provider. Ask your health care provider what activities are safe for you. General instructions  Take over-the-counter and prescription medicines only as told by your health care provider.  Drink enough fluid to keep your urine pale yellow.  Keep all follow-up  visits as told by your health care provider. This is important. Contact a health care provider if:  You have a fever.  Your condition gets worse or you develop new symptoms.  Your family or friends notice any behavioral changes.  You have nausea or vomiting that gets worse.  You have numbness or a "pins and needles" sensation. Get help right away if you:  Have difficulty speaking or moving.  Are always dizzy.  Faint.  Develop severe headaches.  Have weakness in your legs or arms.  Have changes in your hearing or vision.  Develop a stiff neck.  Develop sensitivity to light. Summary  Vertigo is the feeling that you or your surroundings are moving when they are not. Benign positional vertigo is the most common form of vertigo.  The cause of this condition is not known. It may be caused by a disturbance in an area of the inner ear that helps your brain to sense movement and balance.  Symptoms include loss of balance and falling, feeling that you or your surroundings are moving, nausea and vomiting, and blurred vision.  This condition can be diagnosed based on symptoms, physical exam, and other tests, such as MRI, CT scan, eye movement tests, and hearing tests.  Follow safety instructions as told by your health care provider. You will also be told when to contact your health care provider in case of problems. This information is not intended to replace advice given to you by your health care provider. Make sure you discuss any questions you have with your health care provider. Document Released: 05/30/2006 Document Revised: 01/31/2018 Document Reviewed: 01/31/2018 Elsevier Patient Education  2020 [employer].

## 2019-07-09 ENCOUNTER — Ambulatory Visit: Payer: 59 | Admitting: Family Medicine

## 2019-07-18 ENCOUNTER — Other Ambulatory Visit: Payer: Self-pay

## 2019-07-18 ENCOUNTER — Ambulatory Visit (INDEPENDENT_AMBULATORY_CARE_PROVIDER_SITE_OTHER): Payer: 59

## 2019-07-18 ENCOUNTER — Encounter: Payer: Self-pay | Admitting: Podiatry

## 2019-07-18 ENCOUNTER — Ambulatory Visit (INDEPENDENT_AMBULATORY_CARE_PROVIDER_SITE_OTHER): Payer: 59 | Admitting: Podiatry

## 2019-07-18 DIAGNOSIS — M2012 Hallux valgus (acquired), left foot: Secondary | ICD-10-CM | POA: Diagnosis not present

## 2019-07-18 DIAGNOSIS — Z09 Encounter for follow-up examination after completed treatment for conditions other than malignant neoplasm: Secondary | ICD-10-CM

## 2019-07-18 NOTE — Progress Notes (Signed)
Subjective:   Patient ID: Veronica Hawkins, female   DOB: 58 y.o.   MRN: [id number]   HPI Patient states doing very well with minimal discomfort and walking without pain currently   ROS      Objective:  Physical Exam  Neurovascular status intact negative Bevelyn Buckles' sign noted first MPJ looks good with good range of motion no crepitus of the joint with excellent correction of structural deformity clinically     Assessment:  Doing well post bunionectomy left     Plan:  H&P reviewed condition and recommended the continuation of routine healing with elevation compression surgical shoe usage gradual return to soft shoes next couple weeks and range of motion exercises.  Dispensed ankle compression stocking and reappoint 4 weeks or earlier if needed  X-rays indicate there is excellent correction structural bunion with good alignment noted and no pathology with fixation in place

## 2019-07-23 ENCOUNTER — Telehealth: Payer: Self-pay

## 2019-07-23 MED ORDER — PREDNISONE 20 MG PO TABS: ORAL_TABLET | ORAL | 0 refills | Status: DC

## 2019-07-23 NOTE — Telephone Encounter (Signed)
Prescription for prednisone sent in 

## 2019-07-23 NOTE — Telephone Encounter (Signed)
Pt left v/m that she was seen 07/05/19; pt said her rt ear still feels full and achy and pt wants med sent to [facility]. Per 07/05/19 note mentioned if not better would do prednisone taper.

## 2019-08-05 ENCOUNTER — Ambulatory Visit
Admission: RE | Admit: 2019-08-05 | Discharge: 2019-08-05 | Disposition: A | Discharge status: Home / Self Care | Payer: 59 | Source: Ambulatory Visit | Attending: Obstetrics and Gynecology | Admitting: Obstetrics and Gynecology

## 2019-08-05 ENCOUNTER — Other Ambulatory Visit: Payer: Self-pay

## 2019-08-05 DIAGNOSIS — Z1231 Encounter for screening mammogram for malignant neoplasm of breast: Secondary | ICD-10-CM

## 2019-08-22 ENCOUNTER — Encounter: Payer: Self-pay | Admitting: Podiatry

## 2019-08-22 ENCOUNTER — Other Ambulatory Visit: Payer: Self-pay

## 2019-08-22 ENCOUNTER — Ambulatory Visit (INDEPENDENT_AMBULATORY_CARE_PROVIDER_SITE_OTHER): Payer: Self-pay | Admitting: Podiatry

## 2019-08-22 ENCOUNTER — Ambulatory Visit (INDEPENDENT_AMBULATORY_CARE_PROVIDER_SITE_OTHER): Payer: 59

## 2019-08-22 DIAGNOSIS — M2012 Hallux valgus (acquired), left foot: Secondary | ICD-10-CM | POA: Diagnosis not present

## 2019-08-22 NOTE — Progress Notes (Signed)
Subjective:   Patient ID: Veronica Hawkins, female   DOB: 58 y.o.   MRN: [id number]   HPI Patient states doing really well with minimal discomfort left foot and able to do most normal activities   ROS      Objective:  Physical Exam  Neurovascular status intact negative Bevelyn Buckles' sign noted wound edges well coapted left good range of motion no crepitus     Assessment:  Doing well post bunionectomy left     Plan:  H&P reviewed condition recommended the continuation of shoe gear with gradual increase in activities and patient will be seen back on an as-needed basis and is discharged currently unless she needs [place]  X-rays indicate good healing osteotomy fixation in place joint congruence

## 2019-09-14 ENCOUNTER — Other Ambulatory Visit: Payer: Self-pay | Admitting: Family Medicine

## 2019-11-19 ENCOUNTER — Other Ambulatory Visit: Payer: Self-pay

## 2019-11-19 ENCOUNTER — Other Ambulatory Visit: Payer: 59

## 2019-11-19 ENCOUNTER — Other Ambulatory Visit (INDEPENDENT_AMBULATORY_CARE_PROVIDER_SITE_OTHER): Payer: 59

## 2019-11-19 ENCOUNTER — Telehealth: Payer: Self-pay | Admitting: Family Medicine

## 2019-11-19 DIAGNOSIS — R7303 Prediabetes: Secondary | ICD-10-CM

## 2019-11-19 DIAGNOSIS — E7800 Pure hypercholesterolemia, unspecified: Secondary | ICD-10-CM

## 2019-11-19 LAB — COMPREHENSIVE METABOLIC PANEL
ALT: 23 U/L (ref 0–35)
AST: 18 U/L (ref 0–37)
Albumin: 4.1 g/dL (ref 3.5–5.2)
Alkaline Phosphatase: 48 U/L (ref 39–117)
BUN: 20 mg/dL (ref 6–23)
CO2: 28 mEq/L (ref 19–32)
Calcium: 9.6 mg/dL (ref 8.4–10.5)
Chloride: 105 mEq/L (ref 96–112)
Creatinine, Ser: 0.8 mg/dL (ref 0.40–1.20)
GFR: 72.87 mL/min (ref >60.00)
Glucose, Bld: 103 mg/dL — ABNORMAL HIGH (ref 70–99)
Potassium: 4.2 mEq/L (ref 3.5–5.1)
Sodium: 139 mEq/L (ref 135–145)
Total Bilirubin: 0.5 mg/dL (ref 0.2–1.2)
Total Protein: 7 g/dL (ref 6.0–8.3)

## 2019-11-19 LAB — LIPID PANEL
Cholesterol: 154 mg/dL (ref 0–200)
HDL: 54.4 mg/dL (ref >39.00)
LDL Cholesterol: 81 mg/dL (ref 0–99)
NonHDL: 100.06
Total CHOL/HDL Ratio: 3
Triglycerides: 93 mg/dL (ref 0.0–149.0)
VLDL: 18.6 mg/dL (ref 0.0–40.0)

## 2019-11-19 LAB — HEMOGLOBIN A1C: Hgb A1c MFr Bld: 5.6 % (ref 4.6–6.5)

## 2019-11-19 NOTE — Progress Notes (Signed)
No critical labs need to be addressed urgently. We will discuss labs in detail at upcoming office visit.   

## 2019-11-19 NOTE — Telephone Encounter (Signed)
-----   Message from Ellamae Sia sent at 11/08/2019  2:38 PM EST ----- Regarding: Lab orders for Tuesday, 3.16.21 Patient is scheduled for CPX labs, please order future labs, Thanks , Karna Christmas

## 2019-11-26 ENCOUNTER — Ambulatory Visit (INDEPENDENT_AMBULATORY_CARE_PROVIDER_SITE_OTHER): Payer: 59 | Admitting: Family Medicine

## 2019-11-26 ENCOUNTER — Other Ambulatory Visit: Payer: Self-pay

## 2019-11-26 ENCOUNTER — Encounter: Payer: Self-pay | Admitting: Family Medicine

## 2019-11-26 VITALS — BP 130/82 | HR 71 | Temp 98.5°F | Ht 63.25 in | Wt 211.5 lb

## 2019-11-26 DIAGNOSIS — Z0000 Encounter for general adult medical examination without abnormal findings: Secondary | ICD-10-CM

## 2019-11-26 DIAGNOSIS — R7303 Prediabetes: Secondary | ICD-10-CM | POA: Diagnosis not present

## 2019-11-26 DIAGNOSIS — E7800 Pure hypercholesterolemia, unspecified: Secondary | ICD-10-CM

## 2019-11-26 DIAGNOSIS — H9201 Otalgia, right ear: Secondary | ICD-10-CM | POA: Diagnosis not present

## 2019-11-26 MED ORDER — PREDNISONE 10 MG PO TABS: ORAL_TABLET | ORAL | 0 refills | Status: DC

## 2019-11-26 NOTE — Patient Instructions (Signed)
Compete course of steroid x 6 days. Continue allergy regimen.  Call if not improving. Call sooner if new rash on face.

## 2019-11-26 NOTE — Progress Notes (Signed)
Chief Complaint  Patient presents with  . Annual Exam    History of Present Illness: HPI   The patient is here for annual wellness exam and preventative care.     She continued to have vertigo at times, feeling of pressure in right ear.. not as bad as in 06/2019.    Frequent sharp pains in last 24 hours,  Pain to touch in right forehead to chin. Improved some with 600 mg ibuprofen.  no rash. No cough, no congestion. No ST.  On Singulair and zyrtec  Elevated Cholesterol:  At goal on simvastatin. Lab Results  Component Value Date   CHOL 154 11/19/2019   HDL 54.40 11/19/2019   LDLCALC 81 11/19/2019   LDLDIRECT 173.2 03/19/2007   TRIG 93.0 11/19/2019   CHOLHDL 3 11/19/2019  Using medications without problems:none Muscle aches: none Diet compliance: good, increased water Exercise:  Stretching exercises 30 min a day. Other complaints:  prediabetes Lab Results  Component Value Date   HGBA1C 5.6 11/19/2019       This visit occurred during the SARS-CoV-2 public health emergency.  Safety protocols were in place, including screening questions prior to the visit, additional usage of staff PPE, and extensive cleaning of exam room while observing appropriate contact time as indicated for disinfecting solutions.   COVID 19 screen:  No recent travel or known exposure to COVID19 The patient denies respiratory symptoms of COVID 19 at this time. The importance of social distancing was discussed today.     Review of Systems  Constitutional: Negative for chills and fever.  HENT: Negative for congestion and ear pain.   Eyes: Negative for pain and redness.  Respiratory: Negative for cough and shortness of breath.   Cardiovascular: Negative for chest pain, palpitations and leg swelling.  Gastrointestinal: Negative for abdominal pain, blood in stool, constipation, diarrhea, nausea and vomiting.  Genitourinary: Negative for dysuria.  Musculoskeletal: Negative for falls and myalgias.   Skin: Negative for rash.  Neurological: Negative for dizziness.  Psychiatric/Behavioral: Negative for depression. The patient is not nervous/anxious.       Past Medical History:  Diagnosis Date  . Allergy    seasonal  . Arthritis   . Hyperlipidemia     reports that she quit smoking about 9 years ago. Her smoking use included cigarettes. She has never used smokeless tobacco. She reports that she does not drink alcohol or use drugs.   Current Outpatient Medications:  .  cetirizine (ZYRTEC) 10 MG tablet, Take 10 mg by mouth daily., Disp: , Rfl:  .  Cholecalciferol (VITAMIN D3) 10000 units TABS, Take 1 tablet by mouth daily., Disp: , Rfl:  .  clobetasol ointment (TEMOVATE) 0.05 %, APPLY TO AFFECTED AREA ON THE SKIN TWICE A DAY AS NEEDED, Disp: , Rfl: 3 .  Melatonin 10 MG TABS, Take 1 tablet by mouth daily., Disp: , Rfl:  .  meloxicam (MOBIC) 15 MG tablet, TAKE 1 TABLET BY MOUTH EVERY DAY WITH A MEAL, Disp: , Rfl:  .  methocarbamol (ROBAXIN) 500 MG tablet, Take 1 tablet by mouth at bedtime as needed., Disp: , Rfl:  .  montelukast (SINGULAIR) 10 MG tablet, TAKE 1 TABLET BY MOUTH EVERYDAY AT BEDTIME, Disp: 90 tablet, Rfl: 3 .  simvastatin (ZOCOR) 40 MG tablet, TAKE 1 TABLET (40 MG TOTAL) BY MOUTH DAILY., Disp: 90 tablet, Rfl: 3 .  traZODone (DESYREL) 50 MG tablet, TAKE 2 TABLETS BY MOUTH AT BEDTIME AS NEEDED FOR SLEEP, Disp: 180 tablet, Rfl: 0 .  aspirin 81 MG tablet, Take 81 mg by mouth daily., Disp: , Rfl:    Observations/Objective: Blood pressure 130/82, pulse 71, temperature 98.5 F (36.9 C), temperature source Temporal, height 5' 3.25" (1.607 m), weight 211 lb 8 oz (95.9 kg), SpO2 97 %.  Physical Exam Constitutional:      General: She is not in acute distress.    Appearance: Normal appearance. She is well-developed. She is not ill-appearing or toxic-appearing.  HENT:     Head: Normocephalic.     Right Ear: Hearing, ear canal and external ear normal. A middle ear effusion is  present.     Left Ear: Hearing, tympanic membrane, ear canal and external ear normal.     Nose: Nose normal.  Eyes:     General: Lids are normal. Lids are everted, no foreign bodies appreciated.     Conjunctiva/sclera: Conjunctivae normal.     Pupils: Pupils are equal, round, and reactive to light.  Neck:     Thyroid: No thyroid mass or thyromegaly.     Vascular: No carotid bruit.     Trachea: Trachea normal.  Cardiovascular:     Rate and Rhythm: Normal rate and regular rhythm.     Heart sounds: Normal heart sounds, S1 normal and S2 normal. No murmur. No gallop.   Pulmonary:     Effort: Pulmonary effort is normal. No respiratory distress.     Breath sounds: Normal breath sounds. No wheezing, rhonchi or rales.  Abdominal:     General: Bowel sounds are normal. There is no distension or abdominal bruit.     Palpations: Abdomen is soft. There is no fluid wave or mass.     Tenderness: There is no abdominal tenderness. There is no guarding or rebound.     Hernia: No hernia is present.  Musculoskeletal:     Cervical back: Normal range of motion and neck supple.  Lymphadenopathy:     Cervical: No cervical adenopathy.  Skin:    General: Skin is warm and dry.     Findings: No rash.  Neurological:     Mental Status: She is alert.     Cranial Nerves: No cranial nerve deficit.     Sensory: No sensory deficit.  Psychiatric:        Mood and Affect: Mood is not anxious or depressed.        Speech: Speech normal.        Behavior: Behavior normal. Behavior is cooperative.        Judgment: Judgment normal.      Assessment and Plan   The patient's preventative maintenance and recommended screening tests for an annual wellness exam were reviewed in Veronica Hawkins today. Brought up to date unless services declined.  Counselled on the importance of diet, exercise, and its role in overall health and mortality. The patient's FH and SH was reviewed, including their home life, tobacco status, and drug and  alcohol status.   Vaccines:uptodatetd and flu Pap/DVE:Partial hysterectomy.. Followed by GYN every other year. Dr. Creed Hawkins Mammo:11/2020normal Bone Density:Nml years ago.Plan repeat age 69. Low risk Colon:nml 01/2011, recommnded repeat in 10 years, Dr. Fuller Plan. Smoking Status:former,25 pack year history. Asymptomatic, nml spirometry, last CXR 2013 nml.. QUIT 2011 ETOH/ drug use: rare/none Hep C:done HIV screen:refused  HYPERCHOLESTEROLEMIA, PURE Well controlled. Continue current medication.  On statin  Prediabetes Encouraged exercise, weight loss, healthy eating habits. Low carb diet.  Acute ear pain, right Likely eustachian tube dysfunction. Treat with oral steroid course. Continue Zyrtec and Singular.  Veronica Plato, MD   

## 2019-12-09 ENCOUNTER — Other Ambulatory Visit: Payer: Self-pay | Admitting: Family Medicine

## 2019-12-12 ENCOUNTER — Other Ambulatory Visit: Payer: Self-pay | Admitting: Family Medicine

## 2019-12-21 DIAGNOSIS — H9201 Otalgia, right ear: Secondary | ICD-10-CM | POA: Insufficient documentation

## 2019-12-21 NOTE — Assessment & Plan Note (Signed)
Encouraged exercise, weight loss, healthy eating habits. Low carb diet.

## 2019-12-21 NOTE — Assessment & Plan Note (Signed)
Well controlled. Continue current medication. On statin. 

## 2019-12-21 NOTE — Assessment & Plan Note (Signed)
Likely eustachian tube dysfunction. Treat with oral steroid course. Continue Zyrtec and Singular.

## 2020-01-24 ENCOUNTER — Other Ambulatory Visit: Payer: Self-pay | Admitting: Family Medicine

## 2020-06-05 ENCOUNTER — Other Ambulatory Visit: Payer: Self-pay | Admitting: Family Medicine

## 2020-06-30 ENCOUNTER — Other Ambulatory Visit: Payer: Self-pay | Admitting: Obstetrics and Gynecology

## 2020-06-30 DIAGNOSIS — Z1231 Encounter for screening mammogram for malignant neoplasm of breast: Secondary | ICD-10-CM

## 2020-08-06 ENCOUNTER — Ambulatory Visit
Admission: RE | Admit: 2020-08-06 | Discharge: 2020-08-06 | Disposition: A | Discharge status: Home / Self Care | Payer: 59 | Source: Ambulatory Visit | Attending: Obstetrics and Gynecology | Admitting: Obstetrics and Gynecology

## 2020-08-06 ENCOUNTER — Other Ambulatory Visit: Payer: Self-pay

## 2020-08-06 DIAGNOSIS — Z1231 Encounter for screening mammogram for malignant neoplasm of breast: Secondary | ICD-10-CM

## 2020-09-10 ENCOUNTER — Other Ambulatory Visit: Payer: Self-pay

## 2020-09-10 ENCOUNTER — Telehealth: Payer: 59 | Admitting: Internal Medicine

## 2020-09-11 ENCOUNTER — Other Ambulatory Visit: Payer: 59

## 2020-09-11 ENCOUNTER — Encounter: Payer: Self-pay | Admitting: Family Medicine

## 2020-09-11 ENCOUNTER — Other Ambulatory Visit (INDEPENDENT_AMBULATORY_CARE_PROVIDER_SITE_OTHER): Payer: 59 | Admitting: Family Medicine

## 2020-09-11 ENCOUNTER — Telehealth (INDEPENDENT_AMBULATORY_CARE_PROVIDER_SITE_OTHER): Payer: 59 | Admitting: Family Medicine

## 2020-09-11 DIAGNOSIS — J069 Acute upper respiratory infection, unspecified: Secondary | ICD-10-CM

## 2020-09-11 LAB — POC INFLUENZA A&B (BINAX/QUICKVUE)
Influenza A, POC: NEGATIVE
Influenza B, POC: NEGATIVE

## 2020-09-11 NOTE — Assessment & Plan Note (Signed)
Cough (mild) congestion and ear pain and fever  A little improved now (started Tuesday) Neg covid test at home but her daughter tested positive Orders done to test for covid here and also flu  She had a flu shot and is not vaccinated for covid Takes allergy medicines-reviewed/ incl otc flonase Expectorant (with or without DM) may help (inst to stop the pseudoephedrine since it dries her out)  ER precautions discussed inst to call if symptoms worsen or change (esp if she wheezes)  Will call with test results She will continue to isolate and rest/drink fluids

## 2020-09-11 NOTE — Progress Notes (Signed)
Virtual Visit via Video Note  I connected with [name] on 09/11/20 at 11:30 AM EST by a video enabled telemedicine application and verified that I am speaking with the correct person using two identifiers.  Location: Patient: home Provider: office   I discussed the limitations of evaluation and management by telemedicine and the availability of in person appointments. The patient expressed understanding and agreed to proceed.  Parties involved in encounter  Patient: Veronica Hawkins  Provider:  Loura Pardon MD    History of Present Illness: 60 yo pt of Dr Diona Browner presents with uri symptoms   Started 3-4 days ago  Started with temp Fever with temp as high as 101.3  (this am 99.4) Achey/chilled  Headache- is whole back of her head  (eased up yesterday) Ear pain - right ear (no d/c)    Prod cough-cannot get much up (green mucous) -not a whole lot of cough Some congestion  No sob or wheeze   Did not loose taste or smell  No n/v/d   Daughter had pos covid test (at home)  She tested neg along with grandson   She is not immunized for covid   Otc: Ibuprofen Tylenol  Tried mucinex D for a day (made her mouth dry)  Sudafed   Singulare  claritin at home  flonase otc- store brand   Patient Active Problem List   Diagnosis Date Noted  . Viral URI with cough 09/11/2020  . Acute ear pain, right 12/21/2019  . Benign paroxysmal positional vertigo 07/05/2019  . ETD (Eustachian tube dysfunction), right 07/05/2019  . Chronic insomnia 11/09/2017  . Bladder cystocele 08/19/2014  . Bilateral carpal tunnel syndrome 07/27/2012  . Prediabetes 06/10/2011  . Seasonal allergies 06/10/2011  . HYPERCHOLESTEROLEMIA, PURE 03/28/2007  . FIBROCYSTIC BREAST DISEASE 03/15/2007   Past Medical History:  Diagnosis Date  . Allergy    seasonal  . Arthritis   . Hyperlipidemia    Past Surgical History:  Procedure Laterality Date  . ABDOMINAL HYSTERECTOMY  1986  . BREAST BIOPSY  Left   . COLONOSCOPY    . HIP SURGERY     right to remove bands  . SHOULDER ARTHROSCOPY     left ligaments and tendon repair and spurs  . TOTAL HIP ARTHROPLASTY  [id number]   Right hip   Social History   Tobacco Use  . Smoking status: Former Smoker    Types: Cigarettes    Quit date: 03/16/2010    Years since quitting: 10.4  . Smokeless tobacco: Never Used  Substance Use Topics  . Alcohol use: No  . Drug use: No   History reviewed. No pertinent family history. No Known Allergies Current Outpatient Medications on File Prior to Visit  Medication Sig Dispense Refill  . aspirin 81 MG tablet Take 81 mg by mouth daily.    . cetirizine (ZYRTEC) 10 MG tablet Take 10 mg by mouth daily.    . Cholecalciferol (VITAMIN D3) 10000 units TABS Take 1 tablet by mouth daily.    . clobetasol ointment (TEMOVATE) 0.05 % APPLY TO AFFECTED AREA ON THE SKIN TWICE A DAY AS NEEDED  3  . Melatonin 10 MG TABS Take 1 tablet by mouth daily.    . meloxicam (MOBIC) 15 MG tablet TAKE 1 TABLET BY MOUTH EVERY DAY WITH A MEAL    . methocarbamol (ROBAXIN) 500 MG tablet Take 1 tablet by mouth at bedtime as needed.    . montelukast (SINGULAIR) 10 MG tablet TAKE 1 TABLET BY  MOUTH EVERYDAY AT BEDTIME 90 tablet 3  . predniSONE (DELTASONE) 10 MG tablet 3 tabs by mouth daily x 3 days, then 2 tabs by mouth daily x 2 days then 1 tab by mouth daily x 2 days 15 tablet 0  . simvastatin (ZOCOR) 40 MG tablet TAKE 1 TABLET BY MOUTH EVERY DAY 90 tablet 3  . traZODone (DESYREL) 50 MG tablet TAKE 2 TABLETS BY MOUTH AT BEDTIME AS NEEDED FOR SLEEP 180 tablet 1   No current facility-administered medications on file prior to visit.   Review of Systems  Constitutional: Positive for fever and malaise/fatigue. Negative for chills.  HENT: Positive for congestion. Negative for ear pain, sinus pain and sore throat.   Eyes: Negative for blurred vision, discharge and redness.  Respiratory: Positive for cough and sputum production. Negative  for shortness of breath, wheezing and stridor.   Cardiovascular: Negative for chest pain, palpitations and leg swelling.  Gastrointestinal: Negative for abdominal pain, diarrhea, nausea and vomiting.  Musculoskeletal: Negative for myalgias.  Skin: Negative for rash.  Neurological: Positive for headaches. Negative for dizziness.    Observations/Objective: Patient appears well, in no distress, appears tired Weight is baseline  No facial swelling or asymmetry Voice is mildly hoarse No obvious tremor or mobility impairment Moving neck and UEs normally Able to hear the call well  No cough or shortness of breath during interview  Talkative and mentally sharp with no cognitive changes No skin changes on face or neck , no rash or pallor Affect is normal    Assessment and Plan: Problem List Items Addressed This Visit      Respiratory   Viral URI with cough    Cough (mild) congestion and ear pain and fever  A little improved now (started Tuesday) Neg covid test at home but her daughter tested positive Orders done to test for covid here and also flu  She had a flu shot and is not vaccinated for covid Takes allergy medicines-reviewed/ incl otc flonase Expectorant (with or without DM) may help (inst to stop the pseudoephedrine since it dries her out)  ER precautions discussed inst to call if symptoms worsen or change (esp if she wheezes)  Will call with test results She will continue to isolate and rest/drink fluids           Follow Up Instructions: Drink fluids and rest  Continue your allergy medications  Plain mucinex or mucinex DM may be helpful for congestion and cough  Nasal saline may also help with congestion  The office will call you to set up swabs for flu and covid  Isolate yourself until we get a result   If suddenly worse or short of breath please let us know  If symptoms change let us know  Continue to treat fever with ibuprofen or tylenol as well   We will  contact you with results    I discussed the assessment and treatment plan with the patient. The patient was provided an opportunity to ask questions and all were answered. The patient agreed with the plan and demonstrated an understanding of the instructions.   The patient was advised to call back or seek an in-person evaluation if the symptoms worsen or if the condition fails to improve as anticipated.     Loura Pardon, MD

## 2020-09-11 NOTE — Patient Instructions (Addendum)
Drink fluids and rest  Continue your allergy medications  Plain mucinex or mucinex DM may be helpful for congestion and cough  Nasal saline may also help with congestion  The office will call you to set up swabs for flu and covid  Isolate yourself until we get a result   If suddenly worse or short of breath please let us know  If symptoms change let us know  Continue to treat fever with ibuprofen or tylenol as well   We will contact you with results

## 2020-09-15 ENCOUNTER — Telehealth: Payer: Self-pay | Admitting: Family Medicine

## 2020-09-15 LAB — NOVEL CORONAVIRUS, NAA: SARS-CoV-2, NAA: DETECTED — AB

## 2020-09-15 MED ORDER — AMOXICILLIN-POT CLAVULANATE 875-125 MG PO TABS: 1.0000 | ORAL_TABLET | Freq: Two times a day (BID) | ORAL | 0 refills | Status: DC

## 2020-09-15 NOTE — Telephone Encounter (Signed)
Pt notified Rx sent and to schedule f/u (virtual) if no improvement

## 2020-09-15 NOTE — Telephone Encounter (Signed)
I sent augmentin Please keep [place] posted   Schedule virtual if needed

## 2020-09-15 NOTE — Telephone Encounter (Signed)
-----   Message from [place], [place] sent at 09/15/2020  1:16 PM EST ----- Review what she is taking otc please (anything for cough?): Mucinex DM, nasal decongestant , ibuprofen, and tylenol around the clock (fever was 100 last check) How bad is the cough? Bad but not sever she feels like she has chest congestion which is making her cough Is she wheezing or sob ? No wheezing or SOB  Also wonder about sinus infection at this point: pt said she has had sinus inf before and it does feel like this is turning into one she has a lot of facial pressure and sinus pressure   may need to start antibiotic for that: pt would like abx: [facility]

## 2020-11-03 ENCOUNTER — Other Ambulatory Visit: Payer: Self-pay | Admitting: Family Medicine

## 2020-11-05 ENCOUNTER — Encounter: Payer: Self-pay | Admitting: Gastroenterology

## 2020-11-12 ENCOUNTER — Telehealth: Payer: Self-pay | Admitting: Family Medicine

## 2020-11-12 DIAGNOSIS — R7303 Prediabetes: Secondary | ICD-10-CM

## 2020-11-12 NOTE — Telephone Encounter (Signed)
-----   Message from Ellamae Sia sent at 11/09/2020  2:59 PM EST ----- Regarding: Lab orders for Wednesday, 3.23.22 Patient is scheduled for CPX labs, please order future labs, Thanks , Karna Christmas

## 2020-11-25 ENCOUNTER — Other Ambulatory Visit: Payer: Self-pay

## 2020-11-25 ENCOUNTER — Other Ambulatory Visit (INDEPENDENT_AMBULATORY_CARE_PROVIDER_SITE_OTHER): Payer: 59

## 2020-11-25 DIAGNOSIS — R7303 Prediabetes: Secondary | ICD-10-CM

## 2020-11-25 LAB — COMPREHENSIVE METABOLIC PANEL
ALT: 19 U/L (ref 0–35)
AST: 18 U/L (ref 0–37)
Albumin: 4.4 g/dL (ref 3.5–5.2)
Alkaline Phosphatase: 53 U/L (ref 39–117)
BUN: 21 mg/dL (ref 6–23)
CO2: 28 mEq/L (ref 19–32)
Calcium: 9.7 mg/dL (ref 8.4–10.5)
Chloride: 105 mEq/L (ref 96–112)
Creatinine, Ser: 0.85 mg/dL (ref 0.40–1.20)
GFR: 73.53 mL/min (ref >60.00)
Glucose, Bld: 114 mg/dL — ABNORMAL HIGH (ref 70–99)
Potassium: 4.2 mEq/L (ref 3.5–5.1)
Sodium: 141 mEq/L (ref 135–145)
Total Bilirubin: 0.4 mg/dL (ref 0.2–1.2)
Total Protein: 7.1 g/dL (ref 6.0–8.3)

## 2020-11-25 LAB — LIPID PANEL
Cholesterol: 180 mg/dL (ref 0–200)
HDL: 53.2 mg/dL (ref >39.00)
LDL Cholesterol: 99 mg/dL (ref 0–99)
NonHDL: 127.09
Total CHOL/HDL Ratio: 3
Triglycerides: 139 mg/dL (ref 0.0–149.0)
VLDL: 27.8 mg/dL (ref 0.0–40.0)

## 2020-11-25 LAB — HEMOGLOBIN A1C: Hgb A1c MFr Bld: 5.8 % (ref 4.6–6.5)

## 2020-11-25 NOTE — Progress Notes (Signed)
No critical labs need to be addressed urgently. We will discuss labs in detail at upcoming office visit.   

## 2020-12-03 ENCOUNTER — Encounter: Payer: 59 | Admitting: Family Medicine

## 2020-12-03 ENCOUNTER — Other Ambulatory Visit: Payer: Self-pay

## 2020-12-04 ENCOUNTER — Other Ambulatory Visit: Payer: Self-pay | Admitting: Family Medicine

## 2020-12-04 ENCOUNTER — Ambulatory Visit (INDEPENDENT_AMBULATORY_CARE_PROVIDER_SITE_OTHER): Payer: 59 | Admitting: Family Medicine

## 2020-12-04 ENCOUNTER — Encounter: Payer: Self-pay | Admitting: Family Medicine

## 2020-12-04 VITALS — BP 124/80 | HR 60 | Temp 98.3°F | Ht 63.25 in | Wt 213.2 lb

## 2020-12-04 DIAGNOSIS — Z0000 Encounter for general adult medical examination without abnormal findings: Secondary | ICD-10-CM

## 2020-12-04 DIAGNOSIS — R7303 Prediabetes: Secondary | ICD-10-CM

## 2020-12-04 DIAGNOSIS — E7800 Pure hypercholesterolemia, unspecified: Secondary | ICD-10-CM

## 2020-12-04 DIAGNOSIS — M546 Pain in thoracic spine: Secondary | ICD-10-CM | POA: Insufficient documentation

## 2020-12-04 DIAGNOSIS — J309 Allergic rhinitis, unspecified: Secondary | ICD-10-CM | POA: Diagnosis not present

## 2020-12-04 MED ORDER — PREDNISONE 20 MG PO TABS: ORAL_TABLET | ORAL | 0 refills | Status: DC

## 2020-12-04 NOTE — Assessment & Plan Note (Signed)
New Change to  Zyrtec or Xyzal. Continue singulair.  Add prednisone taper.  If not improving in 4-5 days.. consider bacterial infection or  Possible viral infection (doubt COVID today but if not improving have testing)

## 2020-12-04 NOTE — Progress Notes (Signed)
Patient ID: Veronica Hawkins, female    DOB: 1960/09/16, 60 y.o.   MRN: [id number]  This visit was conducted in person.  BP 124/80   Pulse 60   Temp 98.3 F (36.8 C) (Temporal)   Ht 5' 3.25" (1.607 m)   Wt 213 lb 4 oz (96.7 kg)   SpO2 98%   BMI 37.48 kg/m    CC:  Chief Complaint  Patient presents with  . Annual Exam    Subjective:   HPI: Veronica Hawkins is a 60 y.o. female presenting on 12/04/2020 for Annual Exam   Sinus pressure, headache, facial pressure under eyes... 3 days No fever, no ST, no cough, no COVID exposure. Sneezing, runny nose. No SOB. Hx allergies... using Singulair and Claritin and flonase 2 sprays per nostril.  Since last fall.. she has been having pain in  Thoracic spine on right.. started after doing pulling of heavy objects.  Pain off and on.  No weakness, no numbness, no radiation of pain.  Elevated Cholesterol:  At goal on simvastatin 40 mg daily Lab Results  Component Value Date   CHOL 180 11/25/2020   HDL 53.20 11/25/2020   LDLCALC 99 11/25/2020   LDLDIRECT 173.2 03/19/2007   TRIG 139.0 11/25/2020   CHOLHDL 3 11/25/2020  Using medications without problems: Muscle aches:  Diet compliance: moderate Exercise: walking very few days.. planning start daily. Other complaints:  Prediabetes  Lab Results  Component Value Date   HGBA1C 5.8 11/25/2020   Chronic insomnia: stable control on trazodone    Relevant past medical, surgical, family and social history reviewed and updated as indicated. Interim medical history since our last visit reviewed. Allergies and medications reviewed and updated. Outpatient Medications Prior to Visit  Medication Sig Dispense Refill  . aspirin 81 MG tablet Take 81 mg by mouth daily.    . cetirizine (ZYRTEC) 10 MG tablet Take 10 mg by mouth daily.    . Cholecalciferol (VITAMIN D3) 10000 units TABS Take 1 tablet by mouth daily.    . clobetasol ointment (TEMOVATE) 0.05 % APPLY TO AFFECTED AREA ON THE SKIN  TWICE A DAY AS NEEDED  3  . Melatonin 10 MG TABS Take 1 tablet by mouth daily.    . meloxicam (MOBIC) 15 MG tablet TAKE 1 TABLET BY MOUTH EVERY DAY WITH A MEAL    . methocarbamol (ROBAXIN) 500 MG tablet Take 1 tablet by mouth at bedtime as needed.    . montelukast (SINGULAIR) 10 MG tablet TAKE 1 TABLET BY MOUTH EVERYDAY AT BEDTIME 90 tablet 3  . simvastatin (ZOCOR) 40 MG tablet TAKE 1 TABLET BY MOUTH EVERY DAY 90 tablet 3  . traZODone (DESYREL) 50 MG tablet TAKE 2 TABLETS BY MOUTH AT BEDTIME AS NEEDED FOR SLEEP 180 tablet 1  . amoxicillin-clavulanate (AUGMENTIN) 875-125 MG tablet Take 1 tablet by mouth 2 (two) times daily. 14 tablet 0  . predniSONE (DELTASONE) 10 MG tablet 3 tabs by mouth daily x 3 days, then 2 tabs by mouth daily x 2 days then 1 tab by mouth daily x 2 days 15 tablet 0   No facility-administered medications prior to visit.     Per HPI unless specifically indicated in ROS section below Review of Systems  Constitutional: Negative for fatigue and fever.  HENT: Negative for congestion.   Eyes: Negative for pain.  Respiratory: Negative for cough and shortness of breath.   Cardiovascular: Negative for chest pain, palpitations and leg swelling.  Gastrointestinal: Negative for  abdominal pain.  Genitourinary: Negative for dysuria and vaginal bleeding.  Musculoskeletal: Negative for back pain.  Neurological: Negative for syncope, light-headedness and headaches.  Psychiatric/Behavioral: Negative for dysphoric mood.   Objective:  BP 124/80   Pulse 60   Temp 98.3 F (36.8 C) (Temporal)   Ht 5' 3.25" (1.607 m)   Wt 213 lb 4 oz (96.7 kg)   SpO2 98%   BMI 37.48 kg/m   Wt Readings from Last 3 Encounters:  12/04/20 213 lb 4 oz (96.7 kg)  11/26/19 211 lb 8 oz (95.9 kg)  07/05/19 206 lb (93.4 kg)      Physical Exam Constitutional:      General: She is not in acute distress.    Appearance: Normal appearance. She is well-developed. She is obese. She is not ill-appearing or  toxic-appearing.  HENT:     Head: Normocephalic.     Right Ear: Hearing, tympanic membrane, ear canal and external ear normal. No middle ear effusion.     Left Ear: Hearing, tympanic membrane, ear canal and external ear normal.  No middle ear effusion.     Nose: Rhinorrhea present.     Right Turbinates: Enlarged and pale.     Left Turbinates: Enlarged and pale.     Right Sinus: Maxillary sinus tenderness present.     Left Sinus: Maxillary sinus tenderness present.  Eyes:     General: Lids are normal. Lids are everted, no foreign bodies appreciated.     Conjunctiva/sclera:     Right eye: Right conjunctiva is injected. No exudate.    Left eye: Left conjunctiva is injected. No exudate.    Pupils: Pupils are equal, round, and reactive to light.  Neck:     Thyroid: No thyroid mass or thyromegaly.     Vascular: No carotid bruit.     Trachea: Trachea normal.  Cardiovascular:     Rate and Rhythm: Normal rate and regular rhythm.     Heart sounds: Normal heart sounds, S1 normal and S2 normal. No murmur heard. No gallop.   Pulmonary:     Effort: Pulmonary effort is normal. No respiratory distress.     Breath sounds: Normal breath sounds. No wheezing, rhonchi or rales.  Abdominal:     General: Bowel sounds are normal. There is no distension or abdominal bruit.     Palpations: Abdomen is soft. There is no fluid wave or mass.     Tenderness: There is no abdominal tenderness. There is no guarding or rebound.     Hernia: No hernia is present.  Musculoskeletal:     Cervical back: Normal, normal range of motion and neck supple.     Thoracic back: Spasms and tenderness present. No bony tenderness. Normal range of motion.     Lumbar back: Normal.     Comments: ttp over right Paraspinous muscle complex.  Lymphadenopathy:     Cervical: No cervical adenopathy.  Skin:    General: Skin is warm and dry.     Findings: No rash.  Neurological:     Mental Status: She is alert.     Cranial Nerves: No  cranial nerve deficit.     Sensory: No sensory deficit.  Psychiatric:        Mood and Affect: Mood is not anxious or depressed.        Speech: Speech normal.        Behavior: Behavior normal. Behavior is cooperative.        Judgment: Judgment normal.  Results for orders placed or performed in visit on 11/25/20  Comprehensive metabolic panel  Result Value Ref Range   Sodium 141 135 - 145 mEq/L   Potassium 4.2 3.5 - 5.1 mEq/L   Chloride 105 96 - 112 mEq/L   CO2 28 19 - 32 mEq/L   Glucose, Bld 114 (H) 70 - 99 mg/dL   BUN 21 6 - 23 mg/dL   Creatinine, Ser 0.85 0.40 - 1.20 mg/dL   Total Bilirubin 0.4 0.2 - 1.2 mg/dL   Alkaline Phosphatase 53 39 - 117 U/L   AST 18 0 - 37 U/L   ALT 19 0 - 35 U/L   Total Protein 7.1 6.0 - 8.3 g/dL   Albumin 4.4 3.5 - 5.2 g/dL   GFR 73.53 >60.00 mL/min   Calcium 9.7 8.4 - 10.5 mg/dL  Lipid panel  Result Value Ref Range   Cholesterol 180 0 - 200 mg/dL   Triglycerides 139.0 0.0 - 149.0 mg/dL   HDL 53.20 >39.00 mg/dL   VLDL 27.8 0.0 - 40.0 mg/dL   LDL Cholesterol 99 0 - 99 mg/dL   Total CHOL/HDL Ratio 3    NonHDL 127.09   Hemoglobin A1c  Result Value Ref Range   Hgb A1c MFr Bld 5.8 4.6 - 6.5 %    This visit occurred during the SARS-CoV-2 public health emergency.  Safety protocols were in place, including screening questions prior to the visit, additional usage of staff PPE, and extensive cleaning of exam room while observing appropriate contact time as indicated for disinfecting solutions.   COVID 19 screen:  No recent travel or known exposure to COVID19 The patient denies respiratory symptoms of COVID 19 at this time. The importance of social distancing was discussed today.   Assessment and Plan The patient's preventative maintenance and recommended screening tests for an annual wellness exam were reviewed in full today. Brought up to date unless services declined.  Counselled on the importance of diet, exercise, and its role in overall  health and mortality. The patient's FH and SH was reviewed, including their home life, tobacco status, and drug and alcohol status.    Vaccines:uptodatetd and flu,  Discussed COVID19 vaccine side effects and benefits. Strongly encouraged the patient to get the vaccine. Questions answered. Pap/DVE:Partial hysterectomy.. Followed by GYN every other year. Dr. Creed Copper Mammo:08/2020 normal Bone Density:Nml years ago.Plan repeat age 53. Low risk Colon:nml 01/2011, recommnded repeat in 10 years, Dr. Fuller Plan... scheduled Smoking Status:former,25 pack year history. Asymptomatic, nml spirometry, last CXR 2013 nml.. QUIT 2011 ETOH/ drug IDP:OEUM/PNTI Hep C:done HIV screen:refused  Problem List Items Addressed This Visit    Acute right-sided thoracic back pain   Relevant Medications   predniSONE (DELTASONE) 20 MG tablet   Allergic sinusitis    New Change to  Zyrtec or Xyzal. Continue singulair.  Add prednisone taper.  If not improving in 4-5 days.. consider bacterial infection or  Possible viral infection (doubt COVID today but if not improving have testing)      Relevant Medications   predniSONE (DELTASONE) 20 MG tablet   HYPERCHOLESTEROLEMIA, PURE    Stable, chronic.  Continue current medication.   Simvastatin 40 mg daily.      Prediabetes    Encouraged exercise, weight loss, healthy eating habits.        Other Visit Diagnoses    Routine general medical examination at a health care facility    -  Primary     Meds ordered this encounter  Medications  .  predniSONE (DELTASONE) 20 MG tablet    Sig: 3 tabs by mouth daily x 3 days, then 2 tabs by mouth daily x 2 days then 1 tab by mouth daily x 2 days    Dispense:  15 tablet    Refill:  0    Eliezer Lofts, MD

## 2020-12-04 NOTE — Assessment & Plan Note (Signed)
Stable, chronic.  Continue current medication.   Simvastatin 40 mg daily 

## 2020-12-04 NOTE — Patient Instructions (Addendum)
Stop Claritin, change  Back to Zyrtec or Xyzal.  Continue Singulair and flonase. Add prednisone taper.  If not improving in 4-5 days.. consider bacterial infection or  possible viral infection (doubt COVID today but if not improving have testing) Start home physical therapy for back pain. Call if not improving as expected in 5-7 days.

## 2020-12-04 NOTE — Telephone Encounter (Signed)
CPE today 12/04/2020.  Please refill if appropriate after appointment.

## 2020-12-04 NOTE — Assessment & Plan Note (Signed)
Encouraged exercise, weight loss, healthy eating habits. ? ?

## 2021-01-07 ENCOUNTER — Other Ambulatory Visit: Payer: Self-pay

## 2021-01-07 ENCOUNTER — Ambulatory Visit (AMBULATORY_SURGERY_CENTER): Payer: Self-pay | Admitting: *Deleted

## 2021-01-07 VITALS — Ht 63.75 in | Wt 214.0 lb

## 2021-01-07 DIAGNOSIS — Z1211 Encounter for screening for malignant neoplasm of colon: Secondary | ICD-10-CM

## 2021-01-07 MED ORDER — NA SULFATE-K SULFATE-MG SULF 17.5-3.13-1.6 GM/177ML PO SOLN: 1.0000 | Freq: Once | ORAL | 0 refills | Status: AC

## 2021-01-07 NOTE — Progress Notes (Signed)
No egg or soy allergy known to patient  No issues with past sedation with any surgeries or procedures Patient denies ever being told they had issues or difficulty with intubation  No FH of Malignant Hyperthermia No diet pills per patient No home 02 use per patient  No blood thinners per patient  Pt denies issues with constipation  No A fib or A flutter  EMMI video to pt or via MyChart  COVID 19 guidelines implemented in [place] today with Pt and RN  Pt is fully vaccinated  for Covid    Discussed with pt there will be an out-of-pocket cost for prep and that varies from $0 to 70 dollars   Due to the COVID-19 pandemic we are asking patients to follow certain guidelines.  Pt aware of COVID protocols and [facility] guidelines   

## 2021-01-18 ENCOUNTER — Encounter: Payer: Self-pay | Admitting: Gastroenterology

## 2021-01-21 ENCOUNTER — Ambulatory Visit (AMBULATORY_SURGERY_CENTER): Payer: 59 | Admitting: Gastroenterology

## 2021-01-21 ENCOUNTER — Encounter: Payer: Self-pay | Admitting: Gastroenterology

## 2021-01-21 ENCOUNTER — Other Ambulatory Visit: Payer: Self-pay

## 2021-01-21 VITALS — BP 123/66 | HR 53 | Temp 98.0°F | Resp 18 | Ht 63.0 in | Wt 214.0 lb

## 2021-01-21 DIAGNOSIS — D120 Benign neoplasm of cecum: Secondary | ICD-10-CM

## 2021-01-21 DIAGNOSIS — Z1211 Encounter for screening for malignant neoplasm of colon: Secondary | ICD-10-CM

## 2021-01-21 MED ORDER — SODIUM CHLORIDE 0.9 % IV SOLN: 500.0000 mL | Freq: Once | INTRAVENOUS | Status: DC

## 2021-01-21 NOTE — Progress Notes (Signed)
Pt's states no medical or surgical changes since previsit or office visit.  JD vitals and JG IV

## 2021-01-21 NOTE — Progress Notes (Signed)
Called to room to assist during endoscopic procedure.  Patient ID and intended procedure confirmed with present staff. Received instructions for my participation in the procedure from the performing physician.  

## 2021-01-21 NOTE — Op Note (Signed)
[place] Patient Name: Veronica Hawkins Procedure Date: 01/21/2021 7:50 AM MRN: [id number] Endoscopist: Ladene Artist , MD Age: 60 Referring MD:  Date of Birth: 13-Aug-1961 Gender: Female Account #: [id number] Procedure:                Colonoscopy Indications:              Screening for colorectal malignant neoplasm Medicines:                Monitored Anesthesia Care Procedure:                Pre-Anesthesia Assessment:                           - Prior to the procedure, a History and Physical                            was performed, and patient medications and                            allergies were reviewed. The patient's tolerance of                            previous anesthesia was also reviewed. The risks                            and benefits of the procedure and the sedation                            options and risks were discussed with the patient.                            All questions were answered, and informed consent                            was obtained. Prior Anticoagulants: The patient has                            taken no previous anticoagulant or antiplatelet                            agents. ASA Grade Assessment: II - A patient with                            mild systemic disease. After reviewing the risks                            and benefits, the patient was deemed in                            satisfactory condition to undergo the procedure.                           After obtaining informed consent, the colonoscope  was passed under direct vision. Throughout the                            procedure, the patient's blood pressure, pulse, and                            oxygen saturations were monitored continuously. The                            Colonoscope was introduced through the anus and                            advanced to the the cecum, identified by                            appendiceal orifice and  ileocecal valve. The                            ileocecal valve, appendiceal orifice, and rectum                            were photographed. The quality of the bowel                            preparation was excellent. The colonoscopy was                            performed without difficulty. The patient tolerated                            the procedure well. Scope In: 8:04:23 AM Scope Out: 8:16:05 AM Scope Withdrawal Time: 0 hours 9 minutes 26 seconds  Total Procedure Duration: 0 hours 11 minutes 42 seconds  Findings:                 The perianal and digital rectal examinations were                            normal.                           A 8 mm polyp was found in the cecum. The polyp was                            sessile. The polyp was removed with a cold snare.                            Resection and retrieval were complete.                           The exam was otherwise without abnormality on                            direct and retroflexion views. Complications:  No immediate complications. Estimated blood loss:                            None. Estimated Blood Loss:     Estimated blood loss: none. Impression:               - One 8 mm polyp in the cecum, removed with a cold                            snare. Resected and retrieved.                           - The examination was otherwise normal on direct                            and retroflexion views. Recommendation:           - Repeat colonoscopy after studies are complete for                            surveillance based on pathology results.                           - Patient has a contact number available for                            emergencies. The signs and symptoms of potential                            delayed complications were discussed with the                            patient. Return to normal activities tomorrow.                            Written discharge instructions were provided  to the                            patient.                           - Resume previous diet.                           - Continue present medications.                           - Await pathology results. Ladene Artist, MD 01/21/2021 8:21:00 AM This report has been signed electronically.

## 2021-01-21 NOTE — Progress Notes (Signed)
Report given to PACU, vss 

## 2021-01-21 NOTE — Patient Instructions (Signed)
Handouts given on colon polyps.    YOU HAD AN ENDOSCOPIC PROCEDURE TODAY AT THE [facility]:   Refer to the procedure report that was given to you for any specific questions about what was found during the examination.  If the procedure report does not answer your questions, please call your gastroenterologist to clarify.  If you requested that your care partner not be given the details of your procedure findings, then the procedure report has been included in a sealed envelope for you to review at your convenience later.  YOU SHOULD EXPECT: Some feelings of bloating in the abdomen. Passage of more gas than usual.  Walking can help get rid of the air that was put into your GI tract during the procedure and reduce the bloating. If you had a lower endoscopy (such as a colonoscopy or flexible sigmoidoscopy) you may notice spotting of blood in your stool or on the toilet paper. If you underwent a bowel prep for your procedure, you may not have a normal bowel movement for a few days.  Please Note:  You might notice some irritation and congestion in your nose or some drainage.  This is from the oxygen used during your procedure.  There is no need for concern and it should clear up in a day or so.  SYMPTOMS TO REPORT IMMEDIATELY:   Following lower endoscopy (colonoscopy or flexible sigmoidoscopy):  Excessive amounts of blood in the stool  Significant tenderness or worsening of abdominal pains  Swelling of the abdomen that is new, acute  Fever of 100F or higher    For urgent or emergent issues, a gastroenterologist can be reached at any hour by calling [phone]. Do not use MyChart messaging for urgent concerns.    DIET:  We do recommend a small meal at first, but then you may proceed to your regular diet.  Drink plenty of fluids but you should avoid alcoholic beverages for 24 hours.  ACTIVITY:  You should plan to take it easy for the rest of today and you should NOT DRIVE or use  heavy machinery until tomorrow (because of the sedation medicines used during the test).    FOLLOW UP: Our staff will call the number listed on your records 48-72 hours following your procedure to check on you and address any questions or concerns that you may have regarding the information given to you following your procedure. If we do not reach you, we will leave a message.  We will attempt to reach you two times.  During this call, we will ask if you have developed any symptoms of COVID 19. If you develop any symptoms (ie: fever, flu-like symptoms, shortness of breath, cough etc.) before then, please call [phone].  If you test positive for Covid 19 in the 2 weeks post procedure, please call and report this information to us.    If any biopsies were taken you will be contacted by phone or by letter within the next 1-3 weeks.  Please call us at [phone] if you have not heard about the biopsies in 3 weeks.    SIGNATURES/CONFIDENTIALITY: You and/or your care partner have signed paperwork which will be entered into your electronic medical record.  These signatures attest to the fact that that the information above on your After Visit Summary has been reviewed and is understood.  Full responsibility of the confidentiality of this discharge information lies with you and/or your care-partner. 

## 2021-01-25 ENCOUNTER — Telehealth: Payer: Self-pay

## 2021-01-25 NOTE — Telephone Encounter (Signed)
Called [phone] and left a message we tried to reach pt for a follow up call. maw

## 2021-01-25 NOTE — Telephone Encounter (Signed)
  Follow up Call-  Call back number 01/21/2021  Post procedure Call Back phone  # [phone]  Permission to leave phone message Yes  Some recent data might be hidden     Patient questions:  Do you have a fever, pain , or abdominal swelling? No. Pain Score  0 *  Have you tolerated food without any problems? Yes.    Have you been able to return to your normal activities? Yes.    Do you have any questions about your discharge instructions: Diet   No. Medications  No. Follow up visit  No.  Do you have questions or concerns about your Care? No.  Actions: * If pain score is 4 or above: No action needed, pain <4.  1. Have you developed a fever since your procedure? no  2.   Have you had an respiratory symptoms (SOB or cough) since your procedure? no  3.   Have you tested positive for COVID 19 since your procedure no  4.   Have you had any family members/close contacts diagnosed with the COVID 19 since your procedure?  no   If yes to any of these questions please route to Joylene John, RN and Joella Prince, RN

## 2021-01-29 ENCOUNTER — Encounter: Payer: Self-pay | Admitting: Gastroenterology

## 2021-03-05 ENCOUNTER — Other Ambulatory Visit: Payer: Self-pay

## 2021-03-05 ENCOUNTER — Encounter: Payer: Self-pay | Admitting: Family Medicine

## 2021-03-05 ENCOUNTER — Ambulatory Visit: Payer: 59 | Admitting: Family Medicine

## 2021-03-05 VITALS — BP 112/62 | HR 63 | Temp 98.1°F | Ht 63.25 in | Wt 212.0 lb

## 2021-03-05 DIAGNOSIS — D1724 Benign lipomatous neoplasm of skin and subcutaneous tissue of left leg: Secondary | ICD-10-CM

## 2021-03-05 NOTE — Progress Notes (Signed)
Patient ID: Veronica Hawkins, female    DOB: July 31, 1961, 60 y.o.   MRN: [id number]  This visit was conducted in person.  BP 112/62   Pulse 63   Temp 98.1 F (36.7 C) (Temporal)   Ht 5' 3.25" (1.607 m)   Wt 212 lb (96.2 kg)   SpO2 96%   BMI 37.26 kg/m    CC: Chief Complaint  Patient presents with   Knot on Left Leg    Subjective:   HPI: Veronica Hawkins is a 60 y.o. female presenting on 03/05/2021 for Knot on Left Leg  She has noted knot on left outer lower leg.. [place] beach.   No pain, just area of swelling.  No change in size. No redness.   Moderate varicose veins bilaterally.   No change in actively, no fall, no injury.  Wt Readings from Last 3 Encounters:  03/05/21 212 lb (96.2 kg)  01/21/21 214 lb (97.1 kg)  01/07/21 214 lb (97.1 kg)       Relevant past medical, surgical, family and social history reviewed and updated as indicated. Interim medical history since our last visit reviewed. Allergies and medications reviewed and updated. Outpatient Medications Prior to Visit  Medication Sig Dispense Refill   aspirin 81 MG tablet Take 81 mg by mouth daily.     cetirizine (ZYRTEC) 10 MG tablet Take 10 mg by mouth daily.     Cholecalciferol (VITAMIN D3) 10000 units TABS Take 1 tablet by mouth daily.     clobetasol ointment (TEMOVATE) 0.05 % APPLY TO AFFECTED AREA ON THE SKIN TWICE A DAY AS NEEDED  3   docusate sodium (COLACE) 100 MG capsule Take 200 mg by mouth at bedtime as needed for mild constipation.     meloxicam (MOBIC) 15 MG tablet TAKE 1 TABLET BY MOUTH EVERY DAY WITH A MEAL     methocarbamol (ROBAXIN) 500 MG tablet Take 1 tablet by mouth at bedtime as needed.     montelukast (SINGULAIR) 10 MG tablet TAKE 1 TABLET BY MOUTH EVERYDAY AT BEDTIME 90 tablet 3   polyethylene glycol (MIRALAX / GLYCOLAX) 17 g packet Take 17 g by mouth daily.     Polyethylene Glycol 3350 (MIRALAX PO) Take by mouth.     simvastatin (ZOCOR) 40 MG tablet TAKE 1 TABLET BY MOUTH  EVERY DAY 90 tablet 3   traZODone (DESYREL) 50 MG tablet TAKE 2 TABLETS BY MOUTH AT BEDTIME AS NEEDED FOR SLEEP 180 tablet 1   Melatonin 10 MG TABS Take 1 tablet by mouth daily. (Patient not taking: No sig reported)     No facility-administered medications prior to visit.     Per HPI unless specifically indicated in ROS section below Review of Systems  Constitutional:  Negative for fatigue and fever.  HENT:  Negative for ear pain.   Eyes:  Negative for pain.  Respiratory:  Negative for chest tightness and shortness of breath.   Cardiovascular:  Negative for chest pain, palpitations and leg swelling.  Gastrointestinal:  Negative for abdominal pain.  Genitourinary:  Negative for dysuria.  Objective:  BP 112/62   Pulse 63   Temp 98.1 F (36.7 C) (Temporal)   Ht 5' 3.25" (1.607 m)   Wt 212 lb (96.2 kg)   SpO2 96%   BMI 37.26 kg/m   Wt Readings from Last 3 Encounters:  03/05/21 212 lb (96.2 kg)  01/21/21 214 lb (97.1 kg)  01/07/21 214 lb (97.1 kg)  Physical Exam Constitutional:      General: She is not in acute distress.    Appearance: Normal appearance. She is well-developed. She is not ill-appearing or toxic-appearing.  HENT:     Head: Normocephalic.     Right Ear: Hearing, tympanic membrane, ear canal and external ear normal. Tympanic membrane is not erythematous, retracted or bulging.     Left Ear: Hearing, tympanic membrane, ear canal and external ear normal. Tympanic membrane is not erythematous, retracted or bulging.     Nose: No mucosal edema or rhinorrhea.     Right Sinus: No maxillary sinus tenderness or frontal sinus tenderness.     Left Sinus: No maxillary sinus tenderness or frontal sinus tenderness.     Mouth/Throat:     Pharynx: Uvula midline.  Eyes:     General: Lids are normal. Lids are everted, no foreign bodies appreciated.     Conjunctiva/sclera: Conjunctivae normal.     Pupils: Pupils are equal, round, and reactive to light.  Neck:     Thyroid: No  thyroid mass or thyromegaly.     Vascular: No carotid bruit.     Trachea: Trachea normal.  Cardiovascular:     Rate and Rhythm: Normal rate and regular rhythm.     Pulses: Normal pulses.     Heart sounds: Normal heart sounds, S1 normal and S2 normal. No murmur heard.   No friction rub. No gallop.  Pulmonary:     Effort: Pulmonary effort is normal. No tachypnea or respiratory distress.     Breath sounds: Normal breath sounds. No decreased breath sounds, wheezing, rhonchi or rales.  Abdominal:     General: Bowel sounds are normal.     Palpations: Abdomen is soft.     Tenderness: There is no abdominal tenderness.  Musculoskeletal:     Cervical back: Normal range of motion and neck supple.  Skin:    General: Skin is warm and dry.     Findings: No rash.     Comments:  Bilateral varicose veins, no peripheral swelling Small mobile lesion in left outer lower leg  Neurological:     Mental Status: She is alert.  Psychiatric:        Mood and Affect: Mood is not anxious or depressed.        Speech: Speech normal.        Behavior: Behavior normal. Behavior is cooperative.        Thought Content: Thought content normal.        Judgment: Judgment normal.      Results for orders placed or performed in visit on 11/25/20  Comprehensive metabolic panel  Result Value Ref Range   Sodium 141 135 - 145 mEq/L   Potassium 4.2 3.5 - 5.1 mEq/L   Chloride 105 96 - 112 mEq/L   CO2 28 19 - 32 mEq/L   Glucose, Bld 114 (H) 70 - 99 mg/dL   BUN 21 6 - 23 mg/dL   Creatinine, Ser 0.85 0.40 - 1.20 mg/dL   Total Bilirubin 0.4 0.2 - 1.2 mg/dL   Alkaline Phosphatase 53 39 - 117 U/L   AST 18 0 - 37 U/L   ALT 19 0 - 35 U/L   Total Protein 7.1 6.0 - 8.3 g/dL   Albumin 4.4 3.5 - 5.2 g/dL   GFR 73.53 >60.00 mL/min   Calcium 9.7 8.4 - 10.5 mg/dL  Lipid panel  Result Value Ref Range   Cholesterol 180 0 - 200 mg/dL   Triglycerides  139.0 0.0 - 149.0 mg/dL   HDL 53.20 >39.00 mg/dL   VLDL 27.8 0.0 - 40.0 mg/dL    LDL Cholesterol 99 0 - 99 mg/dL   Total CHOL/HDL Ratio 3    NonHDL 127.09   Hemoglobin A1c  Result Value Ref Range   Hgb A1c MFr Bld 5.8 4.6 - 6.5 %    This visit occurred during the SARS-CoV-2 public health emergency.  Safety protocols were in place, including screening questions prior to the visit, additional usage of staff PPE, and extensive cleaning of exam room while observing appropriate contact time as indicated for disinfecting solutions.   COVID 19 screen:  No recent travel or known exposure to COVID19 The patient denies respiratory symptoms of COVID 19 at this time. The importance of social distancing was discussed today.   Assessment and Plan    Problem List Items Addressed This Visit     Lipoma of left lower extremity - Primary    Acute, stable   No concerns on exam for DVT.  Palpated are very consistent with lipoma or less likely cyst.  Pt reassured.. will eval further only if pain pr significant increase in size.        Eliezer Lofts, MD

## 2021-05-12 DIAGNOSIS — D1724 Benign lipomatous neoplasm of skin and subcutaneous tissue of left leg: Secondary | ICD-10-CM | POA: Insufficient documentation

## 2021-05-12 NOTE — Assessment & Plan Note (Signed)
Acute, stable   No concerns on exam for DVT.  Palpated are very consistent with lipoma or less likely cyst.  Pt reassured.. will eval further only if pain pr significant increase in size.

## 2021-05-27 ENCOUNTER — Other Ambulatory Visit: Payer: Self-pay | Admitting: Family Medicine

## 2021-07-08 ENCOUNTER — Other Ambulatory Visit: Payer: Self-pay | Admitting: Obstetrics and Gynecology

## 2021-07-08 DIAGNOSIS — Z1231 Encounter for screening mammogram for malignant neoplasm of breast: Secondary | ICD-10-CM

## 2021-08-10 ENCOUNTER — Ambulatory Visit
Admission: RE | Admit: 2021-08-10 | Discharge: 2021-08-10 | Disposition: A | Discharge status: Home / Self Care | Payer: 59 | Source: Ambulatory Visit | Attending: Obstetrics and Gynecology | Admitting: Obstetrics and Gynecology

## 2021-08-10 DIAGNOSIS — Z1231 Encounter for screening mammogram for malignant neoplasm of breast: Secondary | ICD-10-CM

## 2021-11-09 ENCOUNTER — Telehealth: Payer: Self-pay | Admitting: Family Medicine

## 2021-11-09 DIAGNOSIS — E7800 Pure hypercholesterolemia, unspecified: Secondary | ICD-10-CM

## 2021-11-09 DIAGNOSIS — R7303 Prediabetes: Secondary | ICD-10-CM

## 2021-11-09 NOTE — Telephone Encounter (Signed)
-----   Message from Ellamae Sia sent at 11/03/2021 12:59 PM EST ----- ?Regarding: Lab orders for Thursday, 3.9.23 ?Patient is scheduled for CPX labs, please order future labs, Thanks , Terri ? ? ?

## 2021-11-11 ENCOUNTER — Other Ambulatory Visit: Payer: Self-pay

## 2021-11-11 ENCOUNTER — Other Ambulatory Visit (INDEPENDENT_AMBULATORY_CARE_PROVIDER_SITE_OTHER): Payer: 59

## 2021-11-11 DIAGNOSIS — E7800 Pure hypercholesterolemia, unspecified: Secondary | ICD-10-CM | POA: Diagnosis not present

## 2021-11-11 DIAGNOSIS — R7303 Prediabetes: Secondary | ICD-10-CM

## 2021-11-11 LAB — COMPREHENSIVE METABOLIC PANEL
ALT: 18 U/L (ref 0–35)
AST: 19 U/L (ref 0–37)
Albumin: 4.5 g/dL (ref 3.5–5.2)
Alkaline Phosphatase: 54 U/L (ref 39–117)
BUN: 20 mg/dL (ref 6–23)
CO2: 29 mEq/L (ref 19–32)
Calcium: 9.9 mg/dL (ref 8.4–10.5)
Chloride: 104 mEq/L (ref 96–112)
Creatinine, Ser: 0.79 mg/dL (ref 0.40–1.20)
GFR: 79.74 mL/min (ref >60.00)
Glucose, Bld: 95 mg/dL (ref 70–99)
Potassium: 4.2 mEq/L (ref 3.5–5.1)
Sodium: 140 mEq/L (ref 135–145)
Total Bilirubin: 0.7 mg/dL (ref 0.2–1.2)
Total Protein: 7.5 g/dL (ref 6.0–8.3)

## 2021-11-11 LAB — HEMOGLOBIN A1C: Hgb A1c MFr Bld: 5.9 % (ref 4.6–6.5)

## 2021-11-11 LAB — LIPID PANEL
Cholesterol: 174 mg/dL (ref 0–200)
HDL: 61.3 mg/dL (ref >39.00)
LDL Cholesterol: 84 mg/dL (ref 0–99)
NonHDL: 112.77
Total CHOL/HDL Ratio: 3
Triglycerides: 143 mg/dL (ref 0.0–149.0)
VLDL: 28.6 mg/dL (ref 0.0–40.0)

## 2021-11-12 NOTE — Progress Notes (Signed)
No critical labs need to be addressed urgently. We will discuss labs in detail at upcoming office visit.   

## 2021-11-18 ENCOUNTER — Encounter: Payer: Self-pay | Admitting: Family Medicine

## 2021-11-18 ENCOUNTER — Ambulatory Visit (INDEPENDENT_AMBULATORY_CARE_PROVIDER_SITE_OTHER): Payer: 59 | Admitting: Family Medicine

## 2021-11-18 ENCOUNTER — Other Ambulatory Visit: Payer: Self-pay

## 2021-11-18 VITALS — BP 144/82 | Ht 63.5 in | Wt 212.2 lb

## 2021-11-18 DIAGNOSIS — Z23 Encounter for immunization: Secondary | ICD-10-CM | POA: Diagnosis not present

## 2021-11-18 DIAGNOSIS — Z0000 Encounter for general adult medical examination without abnormal findings: Secondary | ICD-10-CM

## 2021-11-18 DIAGNOSIS — E7800 Pure hypercholesterolemia, unspecified: Secondary | ICD-10-CM

## 2021-11-18 DIAGNOSIS — R7303 Prediabetes: Secondary | ICD-10-CM

## 2021-11-18 MED ORDER — SIMVASTATIN 40 MG PO TABS: 40.0000 mg | ORAL_TABLET | Freq: Every day | ORAL | 3 refills | Status: DC

## 2021-11-18 MED ORDER — MONTELUKAST SODIUM 10 MG PO TABS: ORAL_TABLET | ORAL | 3 refills | Status: DC

## 2021-11-18 NOTE — Progress Notes (Signed)
? ? Patient ID: Veronica Hawkins, female    DOB: March 30, 1961, 61 y.o.   MRN: [id number] ? ?This visit was conducted in person. ? ?BP (!) 144/82   Ht 5' 3.5" (1.613 m)   Wt 212 lb 3.2 oz (96.3 kg)   BMI 37.00 kg/m?   ? ?CC:  ?Chief Complaint  ?Patient presents with  ? Annual Exam  ?  Physical  exam . No other concerns , pt needs some refill on meds.  ? ? ?Subjective:  ? ?HPI: ?Veronica Hawkins is a 61 y.o. female presenting on 11/18/2021 for Annual Exam (Physical  exam . No other concerns , pt needs some refill on meds.) ? ?Elevated Cholesterol: At goal on simvastatin 40 mg daily ?Lab Results  ?Component Value Date  ? CHOL 174 11/11/2021  ? HDL 61.30 11/11/2021  ? [place] 84 11/11/2021  ? LDLDIRECT 173.2 03/19/2007  ? TRIG 143.0 11/11/2021  ? CHOLHDL 3 11/11/2021  ?Using medications without problems: ?Muscle aches:  ?Diet compliance: moderate ?Exercise: adult tricycle, walking ?Other complaints: ? ? Prediabetes  ?Lab Results  ?Component Value Date  ? HGBA1C 5.9 11/11/2021  ? ? ?Chronic insomnia:  Improved with night tea... no longer needing trazodone. ?   ? ?Relevant past medical, surgical, family and social history reviewed and updated as indicated. Interim medical history since our last visit reviewed. ?Allergies and medications reviewed and updated. ?Outpatient Medications Prior to Visit  ?Medication Sig Dispense Refill  ? aspirin 81 MG tablet Take 81 mg by mouth daily.    ? cetirizine (ZYRTEC) 10 MG tablet Take 10 mg by mouth daily.    ? Cholecalciferol (VITAMIN D3) 10000 units TABS Take 1 tablet by mouth daily.    ? clobetasol ointment (TEMOVATE) 0.05 % APPLY TO AFFECTED AREA ON THE SKIN TWICE A DAY AS NEEDED  3  ? docusate sodium (COLACE) 100 MG capsule Take 200 mg by mouth at bedtime as needed for mild constipation.    ? meloxicam (MOBIC) 15 MG tablet TAKE 1 TABLET BY MOUTH EVERY DAY WITH A MEAL    ? methocarbamol (ROBAXIN) 500 MG tablet Take 1 tablet by mouth at bedtime as needed.    ? montelukast  (SINGULAIR) 10 MG tablet TAKE 1 TABLET BY MOUTH EVERYDAY AT BEDTIME 90 tablet 3  ? simvastatin (ZOCOR) 40 MG tablet TAKE 1 TABLET BY MOUTH EVERY DAY 90 tablet 3  ? traZODone (DESYREL) 50 MG tablet TAKE 2 TABLETS BY MOUTH AT BEDTIME AS NEEDED FOR SLEEP. 180 tablet 1  ? polyethylene glycol (MIRALAX / GLYCOLAX) 17 g packet Take 17 g by mouth daily. (Patient not taking: Reported on 11/18/2021)    ? Polyethylene Glycol 3350 (MIRALAX PO) Take by mouth. (Patient not taking: Reported on 11/18/2021)    ? ?No facility-administered medications prior to visit.  ?  ? ?Per HPI unless specifically indicated in ROS section below ?Review of Systems  ?Constitutional:  Negative for fatigue and fever.  ?HENT:  Negative for congestion.   ?Eyes:  Negative for pain.  ?Respiratory:  Negative for cough and shortness of breath.   ?Cardiovascular:  Negative for chest pain, palpitations and leg swelling.  ?Gastrointestinal:  Negative for abdominal pain.  ?Genitourinary:  Negative for dysuria and vaginal bleeding.  ?Musculoskeletal:  Negative for back pain.  ?Neurological:  Negative for syncope, light-headedness and headaches.  ?Psychiatric/Behavioral:  Negative for dysphoric mood.   ?Objective:  ?BP (!) 144/82   Ht 5' 3.5" (1.613 m)   Wt 212 lb  3.2 oz (96.3 kg)   BMI 37.00 kg/m?   ?Wt Readings from Last 3 Encounters:  ?11/18/21 212 lb 3.2 oz (96.3 kg)  ?03/05/21 212 lb (96.2 kg)  ?01/21/21 214 lb (97.1 kg)  ?  ?  ?Physical Exam ?Vitals and nursing note reviewed.  ?Constitutional:   ?   General: She is not in acute distress. ?   Appearance: Normal appearance. She is well-developed. She is not ill-appearing or toxic-appearing.  ?HENT:  ?   Head: Normocephalic.  ?   Right Ear: Hearing, tympanic membrane, ear canal and external ear normal.  ?   Left Ear: Hearing, tympanic membrane, ear canal and external ear normal.  ?   Nose: Nose normal.  ?Eyes:  ?   General: Lids are normal. Lids are everted, no foreign bodies appreciated.  ?    Conjunctiva/sclera: Conjunctivae normal.  ?   Pupils: Pupils are equal, round, and reactive to light.  ?Neck:  ?   Thyroid: No thyroid mass or thyromegaly.  ?   Vascular: No carotid bruit.  ?   Trachea: Trachea normal.  ?Cardiovascular:  ?   Rate and Rhythm: Normal rate and regular rhythm.  ?   Heart sounds: Normal heart sounds, S1 normal and S2 normal. No murmur heard. ?  No gallop.  ?Pulmonary:  ?   Effort: Pulmonary effort is normal. No respiratory distress.  ?   Breath sounds: Normal breath sounds. No wheezing, rhonchi or rales.  ?Abdominal:  ?   General: Bowel sounds are normal. There is no distension or abdominal bruit.  ?   Palpations: Abdomen is soft. There is no fluid wave or mass.  ?   Tenderness: There is no abdominal tenderness. There is no guarding or rebound.  ?   Hernia: No hernia is present.  ?Musculoskeletal:  ?   Cervical back: Normal range of motion and neck supple.  ?Lymphadenopathy:  ?   Cervical: No cervical adenopathy.  ?Skin: ?   General: Skin is warm and dry.  ?   Findings: No rash.  ?Neurological:  ?   Mental Status: She is alert.  ?   Cranial Nerves: No cranial nerve deficit.  ?   Sensory: No sensory deficit.  ?Psychiatric:     ?   Mood and Affect: Mood is not anxious or depressed.     ?   Speech: Speech normal.     ?   Behavior: Behavior normal. Behavior is cooperative.     ?   Judgment: Judgment normal.  ? ?   ?Results for orders placed or performed in visit on 11/11/21  ?Comprehensive metabolic panel  ?Result Value Ref Range  ? Sodium 140 135 - 145 mEq/L  ? Potassium 4.2 3.5 - 5.1 mEq/L  ? Chloride 104 96 - 112 mEq/L  ? CO2 29 19 - 32 mEq/L  ? Glucose, Bld 95 70 - 99 mg/dL  ? BUN 20 6 - 23 mg/dL  ? Creatinine, Ser 0.79 0.40 - 1.20 mg/dL  ? Total Bilirubin 0.7 0.2 - 1.2 mg/dL  ? Alkaline Phosphatase 54 39 - 117 U/L  ? AST 19 0 - 37 U/L  ? ALT 18 0 - 35 U/L  ? Total Protein 7.5 6.0 - 8.3 g/dL  ? Albumin 4.5 3.5 - 5.2 g/dL  ? GFR 79.74 >60.00 mL/min  ? Calcium 9.9 8.4 - 10.5 mg/dL  ?Lipid  panel  ?Result Value Ref Range  ? Cholesterol 174 0 - 200 mg/dL  ? Triglycerides 143.0  0.0 - 149.0 mg/dL  ? HDL 61.30 >39.00 mg/dL  ? VLDL 28.6 0.0 - 40.0 mg/dL  ? LDL Cholesterol 84 0 - 99 mg/dL  ? Total CHOL/HDL Ratio 3   ? NonHDL 112.77   ?Hemoglobin A1c  ?Result Value Ref Range  ? Hgb A1c MFr Bld 5.9 4.6 - 6.5 %  ? ? ?This visit occurred during the SARS-CoV-2 public health emergency.  Safety protocols were in place, including screening questions prior to the visit, additional usage of staff PPE, and extensive cleaning of exam room while observing appropriate contact time as indicated for disinfecting solutions.  ? ?COVID 19 screen:  No recent travel or known exposure to [place] ?The patient denies respiratory symptoms of COVID 19 at this time. ?The importance of social distancing was discussed today.  ? ?Assessment and Plan ?The patient's preventative maintenance and recommended screening tests for an annual wellness exam were reviewed in full today. ?Brought up to date unless services declined. ? ?Counselled on the importance of diet, exercise, and its role in overall health and mortality. ?The patient's FH and SH was reviewed, including their home life, tobacco status, and drug and alcohol status.  ? ?Vaccines: uptodate td and flu,  Discussed COVID19 vaccine side effects and benefits. Strongly encouraged the patient to get the vaccine. Questions answered. Consider shingrix. ?Pap/DVE:  Partial hysterectomy.. Followed by GYN every other year. Dr. Creed Copper ?Mammo: 08/2021  normal ?Bone Density: Nml years ago. Plan repeat age 65. Low risk ?Colon: nml 01/2021 recommnded repeat in 7 years, Dr. Fuller Plan. ?Smoking Status: former,25 pack year history. Asymptomatic, nml spirometry, last CXR 2013 nml..  QUIT 2011 ?ETOH/ drug use: rare/none ? Hep C:  done ? HIV screen:   refused ?  ?Problem List Items Addressed This Visit   ? ? HYPERCHOLESTEROLEMIA, PURE  ?  Stable, chronic.  Continue current medication. ? ? ? simvastatin 40  mg daily ?  ?  ? Relevant Medications  ? simvastatin (ZOCOR) 40 MG tablet  ? Prediabetes  ?  Encouraged exercise, weight loss, healthy eating habits. ? ?  ?  ? ?Other Visit Diagnoses   ? ? Routine general medical examination at a h

## 2021-11-18 NOTE — Patient Instructions (Addendum)
Return for 2nd Shingrix in 2-6 months. ? Keep working on healthy eating and regular exercise! ?

## 2021-11-18 NOTE — Assessment & Plan Note (Signed)
Encouraged exercise, weight loss, healthy eating habits. ? ?

## 2021-11-18 NOTE — Assessment & Plan Note (Signed)
Stable, chronic.  Continue current medication. ? ? ? simvastatin 40 mg daily ?

## 2022-03-04 ENCOUNTER — Other Ambulatory Visit: Payer: Self-pay

## 2022-03-04 ENCOUNTER — Encounter (HOSPITAL_COMMUNITY): Payer: Self-pay

## 2022-03-04 ENCOUNTER — Emergency Department (HOSPITAL_COMMUNITY): Payer: 59

## 2022-03-04 ENCOUNTER — Emergency Department (HOSPITAL_COMMUNITY)
Admission: EM | Admit: 2022-03-04 | Discharge: 2022-03-05 | Disposition: A | Discharge status: Home / Self Care | Payer: 59 | Attending: Emergency Medicine | Admitting: Emergency Medicine

## 2022-03-04 DIAGNOSIS — M25522 Pain in left elbow: Secondary | ICD-10-CM | POA: Insufficient documentation

## 2022-03-04 DIAGNOSIS — W010XXA Fall on same level from slipping, tripping and stumbling without subsequent striking against object, initial encounter: Secondary | ICD-10-CM | POA: Diagnosis not present

## 2022-03-04 DIAGNOSIS — Z7982 Long term (current) use of aspirin: Secondary | ICD-10-CM | POA: Diagnosis not present

## 2022-03-04 DIAGNOSIS — Y9301 Activity, walking, marching and hiking: Secondary | ICD-10-CM | POA: Diagnosis not present

## 2022-03-04 DIAGNOSIS — W19XXXA Unspecified fall, initial encounter: Secondary | ICD-10-CM

## 2022-03-04 NOTE — ED Triage Notes (Signed)
Patient tripped, fell and injured her left elbow. Got swollen quickly. Did not hit her head.

## 2022-03-05 NOTE — ED Provider Notes (Signed)
[facility] DEPT Provider Note   CSN: [id number] Arrival date & time: 03/04/22  2213     History  Chief Complaint  Patient presents with   Lytle Michaels    Veronica Hawkins is a 61 y.o. female.  HPI  Patient without symptoms medical history resents complaints of left elbow pain, states that she suffered a fall earlier today, states that she was walking and tripped  falling directly onto her left elbow, she denies hitting her head losing consciousness she is not on anticoag.  She denies any paresthesia or weakness move down her left arm, she still can move her fingers wrist and has limited range of motion with her left elbow due to pain,  denies any neck back pain chest pain pain in the lower extremities.  She has had nothing for pain at this time.    Home Medications Prior to Admission medications   Medication Sig Start Date End Date Taking? Authorizing Provider  aspirin 81 MG tablet Take 81 mg by mouth daily.    [provider]  cetirizine (ZYRTEC) 10 MG tablet Take 10 mg by mouth daily.    [provider]  Cholecalciferol (VITAMIN D3) 10000 units TABS Take 1 tablet by mouth daily.    [provider]  clobetasol ointment (TEMOVATE) 0.05 % APPLY TO AFFECTED AREA ON THE SKIN TWICE A DAY AS NEEDED 12/16/15   [provider]  docusate sodium (COLACE) 100 MG capsule Take 200 mg by mouth at bedtime as needed for mild constipation.    [provider]  meloxicam (MOBIC) 15 MG tablet TAKE 1 TABLET BY MOUTH EVERY DAY WITH A MEAL 03/11/19   [provider]  methocarbamol (ROBAXIN) 500 MG tablet Take 1 tablet by mouth at bedtime as needed. 09/03/18   [provider]  montelukast (SINGULAIR) 10 MG tablet TAKE 1 TABLET BY MOUTH EVERYDAY AT BEDTIME 11/18/21   Bedsole, Amy E, MD  simvastatin (ZOCOR) 40 MG tablet Take 1 tablet (40 mg total) by mouth daily. 11/18/21   Jinny Sanders, MD      Allergies    Patient has  no known allergies.    Review of Systems   Review of Systems  Constitutional:  Negative for chills and fever.  Respiratory:  Negative for shortness of breath.   Cardiovascular:  Negative for chest pain.  Gastrointestinal:  Negative for abdominal pain.  Musculoskeletal:        Left elbow pain  Neurological:  Negative for headaches.    Physical Exam Updated Vital Signs BP (!) 188/97 (BP Location: Right Arm)   Pulse 82   Temp 98.9 F (37.2 C) (Oral)   Resp 17   Ht '5\' 3"'$  (1.6 m)   Wt 93.4 kg   SpO2 97%   BMI 36.49 kg/m  Physical Exam Vitals and nursing note reviewed.  Constitutional:      General: She is not in acute distress.    Appearance: She is not ill-appearing.  HENT:     Head: Normocephalic and atraumatic.     Comments: No noted deformity of the head present    Nose: No congestion.     Mouth/Throat:     Mouth: Mucous membranes are moist.     Pharynx: Oropharynx is clear.     Comments: No trismus no torticollis no oral trauma present. Eyes:     Extraocular Movements: Extraocular movements intact.     Conjunctiva/sclera: Conjunctivae normal.  Cardiovascular:     Rate  and Rhythm: Normal rate and regular rhythm.     Pulses: Normal pulses.     Heart sounds: No murmur heard.    No friction rub. No gallop.  Pulmonary:     Effort: No respiratory distress.     Breath sounds: No wheezing, rhonchi or rales.  Musculoskeletal:     Comments: Spine was palpated was nontender to palpation no step-off deformities noted, no pelvis instability no leg shortening able to ambulate difficulty.  Patient has point tenderness on her left elbow, no crepitus or deformities noted, she has limited range of motion flexion extension but this secondary to pain.  She has full range of motion her fingers wrist elbow and shoulders.  Skin:    General: Skin is warm and dry.  Neurological:     Mental Status: She is alert.  Psychiatric:        Mood and Affect: Mood normal.     ED Results /  Procedures / Treatments   Labs (all labs ordered are listed, but only abnormal results are displayed) Labs Reviewed - No data to display  EKG None  Radiology DG Elbow Complete Left  Result Date: 03/04/2022 CLINICAL DATA:  Fall today with pain since that time. EXAM: LEFT ELBOW - COMPLETE 3+ VIEW COMPARISON:  None Available. FINDINGS: There is no evidence of fracture, dislocation, or joint effusion. Alignment is maintained. Joint spaces are preserved. There is no evidence of arthropathy or other focal bone abnormality. Soft tissues are unremarkable. IMPRESSION: No fracture or dislocation of the left elbow. Electronically Signed   By: Keith Rake M.D.   On: 03/04/2022 23:05    Procedures Procedures    Medications Ordered in ED Medications - No data to display  ED Course/ Medical Decision Making/ A&P                           Medical Decision Making Amount and/or Complexity of Data Reviewed Radiology: ordered.   This patient presents to the ED for concern of fall, this involves an extensive number of treatment options, and is a complaint that carries with it a high risk of complications and morbidity.  The differential diagnosis includes fracture, dislocation, compartment syndrome    Additional history obtained:  Additional history obtained from daughter at bedside External records from outside source obtained and reviewed including N/A   Co morbidities that complicate the patient evaluation  N/A  Social Determinants of Health:  N/A    Lab Tests:  I Ordered, and personally interpreted labs.  The pertinent results include: N/A   Imaging Studies ordered:  I ordered imaging studies including x-ray of left elbow I independently visualized and interpreted imaging which showed negative acute findings I agree with the radiologist interpretation   Cardiac Monitoring:  The patient was maintained on a cardiac monitor.  I personally viewed and interpreted the  cardiac monitored which showed an underlying rhythm of: N/A   Medicines ordered and prescription drug management:  I ordered medication including N/A I have reviewed the patients home medicines and have made adjustments as needed  Critical Interventions:  N/A   Reevaluation:  Presents as a fall, triage obtain imaging which I personally reviewed it was unremarkable, she had benign physical exam, agree with plan discharge.  Consultations Obtained:  N/A    Test Considered:  CT head-deferred as my suspicion for intracranial head bleed at this time no head trauma, not anticoag's, no focal deficits present.  Rule out I have low suspicion for septic arthritis as patient denies IV drug use, skin exam was performed no erythematous, edematous, warm joints noted on exam, no new heart murmur heard on exam.  Low suspicion for fracture or dislocation as x-ray does not feel any significant findings. low suspicion for ligament or tendon damage as area was palpated no gross defects noted.  Low suspicion for compartment syndrome as area was palpated it was soft to the touch, neurovascular fully intact.     Dispostion and problem list  After consideration of the diagnostic results and the patients response to treatment, I feel that the patent would benefit from discharge.  1/.  Left elbow pain-likely muscular in nature recommend symptom management follow-up with Ortho as needed strict return precautions.            Final Clinical Impression(s) / ED Diagnoses Final diagnoses:  Fall, initial encounter    Rx / DC Orders ED Discharge Orders     None         Marcello Fennel, PA-C 03/05/22 0127    Maudie Flakes, MD 03/05/22 [phone]

## 2022-03-05 NOTE — Discharge Instructions (Signed)
Exam and imaging look reassuring, I suspect you likely just bruised your left elbow, you have some small abrasions over the area please keep the area clean.  You may use over-the-counter pain medication as needed.  I recommend ice or heat to the area and this can help with pain and swelling  If you continue the pain after 1 week I recommend follow-up with your Ortho  doctor for further evaluation.  Come back to the emergency department if you develop chest pain, shortness of breath, severe abdominal pain, uncontrolled nausea, vomiting, diarrhea.

## 2022-03-18 ENCOUNTER — Ambulatory Visit: Payer: 59 | Admitting: Family Medicine

## 2022-03-18 ENCOUNTER — Encounter: Payer: Self-pay | Admitting: Family Medicine

## 2022-03-18 VITALS — BP 138/80 | HR 71 | Temp 98.5°F | Ht 63.5 in | Wt 214.4 lb

## 2022-03-18 DIAGNOSIS — B370 Candidal stomatitis: Secondary | ICD-10-CM | POA: Insufficient documentation

## 2022-03-18 MED ORDER — NYSTATIN 100000 UNIT/ML MT SUSP: 5.0000 mL | Freq: Four times a day (QID) | OROMUCOSAL | 0 refills | Status: AC

## 2022-03-18 NOTE — Progress Notes (Signed)
Patient ID: Veronica Hawkins, female    DOB: 19-Feb-1961, 61 y.o.   MRN: [id number]  This visit was conducted in person.  BP 138/80 (BP Location: Left Arm, Patient Position: Sitting)   Pulse 71   Temp 98.5 F (36.9 C) (Oral)   Ht 5' 3.5" (1.613 m)   Wt 214 lb 6 oz (97.2 kg)   SpO2 95%   BMI 37.38 kg/m    CC:  Chief Complaint  Patient presents with   Mouth Lesions    Thrush off and on since May    Subjective:   HPI: Veronica Hawkins is a 61 y.o. female presenting on 03/18/2022 for Mouth Lesions (Thrush off and on since May)    Starting in 01/2022.. had oral soreness, white plaque in jars, throat.  Treated with nystatin mouthwash. Also treated with antibiotics  (doxycycline) for sinus infection. Things improved until June  Oral symptoms improved.. treated with fluconazole 150 mg   Improved some but had sores.  Saw dentist.. treated with [facility]  magic mouthwash...   She now has warm salt water 4 times daily.     Now have sores in gum line and white plaque in mouth.. has started getting better.   Doe use flonase.   Relevant past medical, surgical, family and social history reviewed and updated as indicated. Interim medical history since our last visit reviewed. Allergies and medications reviewed and updated. Outpatient Medications Prior to Visit  Medication Sig Dispense Refill   aspirin 81 MG tablet Take 81 mg by mouth daily.     cetirizine (ZYRTEC) 10 MG tablet Take 10 mg by mouth daily.     Cholecalciferol (VITAMIN D3) 10000 units TABS Take 1 tablet by mouth daily.     clobetasol ointment (TEMOVATE) 0.05 % APPLY TO AFFECTED AREA ON THE SKIN TWICE A DAY AS NEEDED  3   docusate sodium (COLACE) 100 MG capsule Take 200 mg by mouth at bedtime as needed for mild constipation.     meloxicam (MOBIC) 15 MG tablet TAKE 1 TABLET BY MOUTH EVERY DAY WITH A MEAL     methocarbamol (ROBAXIN) 500 MG tablet Take 1 tablet by mouth at bedtime as needed.     montelukast (SINGULAIR) 10 MG  tablet TAKE 1 TABLET BY MOUTH EVERYDAY AT BEDTIME 90 tablet 3   simvastatin (ZOCOR) 40 MG tablet Take 1 tablet (40 mg total) by mouth daily. 90 tablet 3   No facility-administered medications prior to visit.     Per HPI unless specifically indicated in ROS section below Review of Systems  Constitutional:  Negative for fatigue and fever.  HENT:  Negative for congestion.   Eyes:  Negative for pain.  Respiratory:  Negative for cough and shortness of breath.   Cardiovascular:  Negative for chest pain, palpitations and leg swelling.  Gastrointestinal:  Negative for abdominal pain.  Genitourinary:  Negative for dysuria and vaginal bleeding.  Musculoskeletal:  Negative for back pain.  Neurological:  Negative for syncope, light-headedness and headaches.  Psychiatric/Behavioral:  Negative for dysphoric mood.    Objective:  BP 138/80 (BP Location: Left Arm, Patient Position: Sitting)   Pulse 71   Temp 98.5 F (36.9 C) (Oral)   Ht 5' 3.5" (1.613 m)   Wt 214 lb 6 oz (97.2 kg)   SpO2 95%   BMI 37.38 kg/m   Wt Readings from Last 3 Encounters:  03/18/22 214 lb 6 oz (97.2 kg)  03/04/22 206 lb (93.4 kg)  11/18/21 212  lb 3.2 oz (96.3 kg)      Physical Exam Constitutional:      General: She is not in acute distress.    Appearance: Normal appearance. She is well-developed. She is not ill-appearing or toxic-appearing.  HENT:     Head: Normocephalic.     Right Ear: Hearing, tympanic membrane, ear canal and external ear normal. Tympanic membrane is not erythematous, retracted or bulging.     Left Ear: Hearing, tympanic membrane, ear canal and external ear normal. Tympanic membrane is not erythematous, retracted or bulging.     Nose: No mucosal edema or rhinorrhea.     Right Sinus: No maxillary sinus tenderness or frontal sinus tenderness.     Left Sinus: No maxillary sinus tenderness or frontal sinus tenderness.     Mouth/Throat:     Mouth: Oral lesions present.     Dentition: Gum lesions  present.     Tongue: No lesions.     Pharynx: Uvula midline.     Comments: White lacy plaque on buccal mucosa, shallpow ulcers. Eyes:     General: Lids are normal. Lids are everted, no foreign bodies appreciated.     Conjunctiva/sclera: Conjunctivae normal.     Pupils: Pupils are equal, round, and reactive to light.  Neck:     Thyroid: No thyroid mass or thyromegaly.     Vascular: No carotid bruit.     Trachea: Trachea normal.  Cardiovascular:     Rate and Rhythm: Normal rate and regular rhythm.     Pulses: Normal pulses.     Heart sounds: Normal heart sounds, S1 normal and S2 normal. No murmur heard.    No friction rub. No gallop.  Pulmonary:     Effort: Pulmonary effort is normal. No tachypnea or respiratory distress.     Breath sounds: Normal breath sounds. No decreased breath sounds, wheezing, rhonchi or rales.  Abdominal:     General: Bowel sounds are normal.     Palpations: Abdomen is soft.     Tenderness: There is no abdominal tenderness.  Musculoskeletal:     Cervical back: Normal range of motion and neck supple.  Skin:    General: Skin is warm and dry.     Findings: No rash.  Neurological:     Mental Status: She is alert.  Psychiatric:        Mood and Affect: Mood is not anxious or depressed.        Speech: Speech normal.        Behavior: Behavior normal. Behavior is cooperative.        Thought Content: Thought content normal.        Judgment: Judgment normal.          Results for orders placed or performed in visit on 11/11/21  Comprehensive metabolic panel  Result Value Ref Range   Sodium 140 135 - 145 mEq/L   Potassium 4.2 3.5 - 5.1 mEq/L   Chloride 104 96 - 112 mEq/L   CO2 29 19 - 32 mEq/L   Glucose, Bld 95 70 - 99 mg/dL   BUN 20 6 - 23 mg/dL   Creatinine, Ser 0.79 0.40 - 1.20 mg/dL   Total Bilirubin 0.7 0.2 - 1.2 mg/dL   Alkaline Phosphatase 54 39 - 117 U/L   AST 19 0 - 37 U/L   ALT 18 0 - 35 U/L   Total Protein 7.5 6.0 - 8.3 g/dL   Albumin 4.5  3.5 - 5.2 g/dL   GFR  79.74 >60.00 mL/min   Calcium 9.9 8.4 - 10.5 mg/dL  Lipid panel  Result Value Ref Range   Cholesterol 174 0 - 200 mg/dL   Triglycerides 143.0 0.0 - 149.0 mg/dL   HDL 61.30 >39.00 mg/dL   VLDL 28.6 0.0 - 40.0 mg/dL   LDL Cholesterol 84 0 - 99 mg/dL   Total CHOL/HDL Ratio 3    NonHDL 112.77   Hemoglobin A1c  Result Value Ref Range   Hgb A1c MFr Bld 5.9 4.6 - 6.5 %     COVID 19 screen:  No recent travel or known exposure to COVID19 The patient denies respiratory symptoms of COVID 19 at this time. The importance of social distancing was discussed today.   Assessment and Plan    Problem List Items Addressed This Visit     Candida infection, oral - Primary     Acute  Oral lesions potentially due to persistent candidiasis following use of Flonase and recent antibiotics.  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  She does not have rash elsewhere.  We will start with more aggressive course for 14 days of nystatin swish.  If symptoms still do not improve we can try a 7 to 14-day course of fluconazole.  Following that we may need to refer her to dermatology for biopsy.      Meds ordered this encounter  Medications   nystatin (MYCOSTATIN) 100000 UNIT/ML suspension    Sig: Take 5 mLs (500,000 Units total) by mouth 4 (four) times daily for 14 days.    Dispense:  473 mL    Refill:  0      Eliezer Lofts, MD

## 2022-03-18 NOTE — Assessment & Plan Note (Signed)
Acute  Oral lesions potentially due to persistent candidiasis following use of Flonase and recent antibiotics.  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  She does not have rash elsewhere.  We will start with more aggressive course for 14 days of nystatin swish.  If symptoms still do not improve we can try a 7 to 14-day course of fluconazole.  Following that we may need to refer her to dermatology for biopsy.

## 2022-03-18 NOTE — Patient Instructions (Signed)
Hold Flonase for now.

## 2022-04-21 ENCOUNTER — Ambulatory Visit (INDEPENDENT_AMBULATORY_CARE_PROVIDER_SITE_OTHER): Payer: 59

## 2022-04-21 DIAGNOSIS — Z23 Encounter for immunization: Secondary | ICD-10-CM

## 2022-04-21 NOTE — Progress Notes (Signed)
Per orders of Dr. Diona Browner, 2nd injection of Shingrix given by Loreen Freud. Patient tolerated injection well.

## 2022-07-05 ENCOUNTER — Other Ambulatory Visit: Payer: Self-pay | Admitting: Obstetrics and Gynecology

## 2022-07-05 DIAGNOSIS — Z1231 Encounter for screening mammogram for malignant neoplasm of breast: Secondary | ICD-10-CM

## 2022-09-01 ENCOUNTER — Ambulatory Visit: Payer: 59

## 2022-09-28 ENCOUNTER — Ambulatory Visit
Admission: RE | Admit: 2022-09-28 | Discharge: 2022-09-28 | Disposition: A | Discharge status: Home / Self Care | Payer: 59 | Source: Ambulatory Visit | Attending: Obstetrics and Gynecology | Admitting: Obstetrics and Gynecology

## 2022-09-28 ENCOUNTER — Ambulatory Visit: Payer: 59

## 2022-09-28 DIAGNOSIS — Z1231 Encounter for screening mammogram for malignant neoplasm of breast: Secondary | ICD-10-CM

## 2022-11-04 ENCOUNTER — Telehealth: Payer: Self-pay | Admitting: *Deleted

## 2022-11-04 DIAGNOSIS — E7800 Pure hypercholesterolemia, unspecified: Secondary | ICD-10-CM

## 2022-11-04 DIAGNOSIS — R7303 Prediabetes: Secondary | ICD-10-CM

## 2022-11-04 NOTE — Telephone Encounter (Signed)
Labs orders for CPE

## 2022-11-11 ENCOUNTER — Other Ambulatory Visit: Payer: 59

## 2022-11-14 ENCOUNTER — Other Ambulatory Visit (INDEPENDENT_AMBULATORY_CARE_PROVIDER_SITE_OTHER): Payer: 59

## 2022-11-14 DIAGNOSIS — R7303 Prediabetes: Secondary | ICD-10-CM

## 2022-11-14 DIAGNOSIS — E7800 Pure hypercholesterolemia, unspecified: Secondary | ICD-10-CM

## 2022-11-14 LAB — COMPREHENSIVE METABOLIC PANEL
ALT: 16 U/L (ref 0–35)
AST: 13 U/L (ref 0–37)
Albumin: 3.8 g/dL (ref 3.5–5.2)
Alkaline Phosphatase: 55 U/L (ref 39–117)
BUN: 14 mg/dL (ref 6–23)
CO2: 30 mEq/L (ref 19–32)
Calcium: 10.2 mg/dL (ref 8.4–10.5)
Chloride: 102 mEq/L (ref 96–112)
Creatinine, Ser: 0.86 mg/dL (ref 0.40–1.20)
GFR: 71.51 mL/min (ref >60.00)
Glucose, Bld: 96 mg/dL (ref 70–99)
Potassium: 4.2 mEq/L (ref 3.5–5.1)
Sodium: 139 mEq/L (ref 135–145)
Total Bilirubin: 0.6 mg/dL (ref 0.2–1.2)
Total Protein: 7 g/dL (ref 6.0–8.3)

## 2022-11-14 LAB — LIPID PANEL
Cholesterol: 159 mg/dL (ref 0–200)
HDL: 50.9 mg/dL (ref >39.00)
LDL Cholesterol: 79 mg/dL (ref 0–99)
NonHDL: 108.38
Total CHOL/HDL Ratio: 3
Triglycerides: 146 mg/dL (ref 0.0–149.0)
VLDL: 29.2 mg/dL (ref 0.0–40.0)

## 2022-11-14 LAB — HEMOGLOBIN A1C: Hgb A1c MFr Bld: 6.1 % (ref 4.6–6.5)

## 2022-11-15 NOTE — Progress Notes (Signed)
No critical labs need to be addressed urgently. We will discuss labs in detail at upcoming office visit.   

## 2022-11-19 ENCOUNTER — Other Ambulatory Visit: Payer: Self-pay | Admitting: Family Medicine

## 2022-11-22 ENCOUNTER — Ambulatory Visit (INDEPENDENT_AMBULATORY_CARE_PROVIDER_SITE_OTHER): Payer: 59 | Admitting: Family Medicine

## 2022-11-22 ENCOUNTER — Encounter: Payer: Self-pay | Admitting: Family Medicine

## 2022-11-22 VITALS — BP 120/80 | HR 83 | Temp 97.8°F | Ht 63.0 in | Wt 215.4 lb

## 2022-11-22 DIAGNOSIS — Z0000 Encounter for general adult medical examination without abnormal findings: Secondary | ICD-10-CM | POA: Diagnosis not present

## 2022-11-22 DIAGNOSIS — E7800 Pure hypercholesterolemia, unspecified: Secondary | ICD-10-CM | POA: Diagnosis not present

## 2022-11-22 DIAGNOSIS — Z23 Encounter for immunization: Secondary | ICD-10-CM | POA: Diagnosis not present

## 2022-11-22 DIAGNOSIS — R7303 Prediabetes: Secondary | ICD-10-CM

## 2022-11-22 NOTE — Patient Instructions (Signed)
Work on regular exercise and low carb low cholesterol diet.

## 2022-11-22 NOTE — Progress Notes (Signed)
Patient ID: Veronica Hawkins, female    DOB: 1960/09/17, 62 y.o.   MRN: [id number]  This visit was conducted in person.  BP 120/80   Pulse 83   Temp 97.8 F (36.6 C) (Temporal)   Ht 5\' 3"  (1.6 m)   Wt 215 lb 6 oz (97.7 kg)   SpO2 96%   BMI 38.15 kg/m    CC:  Annual.  Subjective:   HPI: Veronica Hawkins is a 62 y.o. female presenting on 11/22/2022 for  physical. The patient presents for  complete physical and review of chronic health problems. He/She also has the following acute concerns today: none   Shoulder surgery 2 weeks ago for OA.  Elevated Cholesterol: At goal on simvastatin 40 mg daily Lab Results  Component Value Date   CHOL 159 11/14/2022   HDL 50.90 11/14/2022   LDLCALC 79 11/14/2022   LDLDIRECT 173.2 03/19/2007   TRIG 146.0 11/14/2022   CHOLHDL 3 11/14/2022  Using medications without problems: none Muscle aches:  none Diet compliance: moderate Exercise: adult tricycle, walking Other complaints:  Wt Readings from Last 3 Encounters:  11/22/22 215 lb 6 oz (97.7 kg)  03/18/22 214 lb 6 oz (97.2 kg)  03/04/22 206 lb (93.4 kg)     Prediabetes  Has been using topical steroids in mouth for ulcer. Lab Results  Component Value Date   HGBA1C 6.1 11/14/2022    Chronic insomnia:  Improved with night tea... no longer needing trazodone.     Relevant past medical, surgical, family and social history reviewed and updated as indicated. Interim medical history since our last visit reviewed. Allergies and medications reviewed and updated. Outpatient Medications Prior to Visit  Medication Sig Dispense Refill   aspirin 81 MG tablet Take 81 mg by mouth daily.     cetirizine (ZYRTEC) 10 MG tablet Take 10 mg by mouth daily.     Cholecalciferol (VITAMIN D3) 10000 units TABS Take 1 tablet by mouth daily.     clobetasol ointment (TEMOVATE) 0.05 % APPLY TO AFFECTED AREA ON THE SKIN TWICE A DAY AS NEEDED  3   cyclobenzaprine (FLEXERIL) 10 MG tablet Take 10 mg by mouth  every 6 (six) hours as needed.     docusate sodium (COLACE) 100 MG capsule Take 200 mg by mouth at bedtime as needed for mild constipation.     methocarbamol (ROBAXIN) 500 MG tablet Take 1 tablet by mouth at bedtime as needed.     montelukast (SINGULAIR) 10 MG tablet TAKE 1 TABLET BY MOUTH EVERYDAY AT BEDTIME 90 tablet 3   simvastatin (ZOCOR) 40 MG tablet TAKE 1 TABLET BY MOUTH EVERY DAY 90 tablet 3   meloxicam (MOBIC) 15 MG tablet TAKE 1 TABLET BY MOUTH EVERY DAY WITH A MEAL     No facility-administered medications prior to visit.     Per HPI unless specifically indicated in ROS section below Review of Systems  Constitutional:  Negative for fatigue and fever.  HENT:  Negative for congestion.   Eyes:  Negative for pain.  Respiratory:  Negative for cough and shortness of breath.   Cardiovascular:  Negative for chest pain, palpitations and leg swelling.  Gastrointestinal:  Negative for abdominal pain.  Genitourinary:  Negative for dysuria and vaginal bleeding.  Musculoskeletal:  Negative for back pain.  Neurological:  Negative for syncope, light-headedness and headaches.  Psychiatric/Behavioral:  Negative for dysphoric mood.    Objective:  BP 120/80   Pulse 83   Temp 97.8 F (  36.6 C) (Temporal)   Ht 5\' 3"  (1.6 m)   Wt 215 lb 6 oz (97.7 kg)   SpO2 96%   BMI 38.15 kg/m   Wt Readings from Last 3 Encounters:  11/22/22 215 lb 6 oz (97.7 kg)  03/18/22 214 lb 6 oz (97.2 kg)  03/04/22 206 lb (93.4 kg)      Physical Exam Vitals and nursing note reviewed.  Constitutional:      General: She is not in acute distress.    Appearance: Normal appearance. She is well-developed. She is not ill-appearing or toxic-appearing.  HENT:     Head: Normocephalic.     Right Ear: Hearing, tympanic membrane, ear canal and external ear normal.     Left Ear: Hearing, tympanic membrane, ear canal and external ear normal.     Nose: Nose normal.  Eyes:     General: Lids are normal. Lids are everted, no  foreign bodies appreciated.     Conjunctiva/sclera: Conjunctivae normal.     Pupils: Pupils are equal, round, and reactive to light.  Neck:     Thyroid: No thyroid mass or thyromegaly.     Vascular: No carotid bruit.     Trachea: Trachea normal.  Cardiovascular:     Rate and Rhythm: Normal rate and regular rhythm.     Heart sounds: Normal heart sounds, S1 normal and S2 normal. No murmur heard.    No gallop.  Pulmonary:     Effort: Pulmonary effort is normal. No respiratory distress.     Breath sounds: Normal breath sounds. No wheezing, rhonchi or rales.  Abdominal:     General: Bowel sounds are normal. There is no distension or abdominal bruit.     Palpations: Abdomen is soft. There is no fluid wave or mass.     Tenderness: There is no abdominal tenderness. There is no guarding or rebound.     Hernia: No hernia is present.  Musculoskeletal:     Cervical back: Normal range of motion and neck supple.  Lymphadenopathy:     Cervical: No cervical adenopathy.  Skin:    General: Skin is warm and dry.     Findings: No rash.  Neurological:     Mental Status: She is alert.     Cranial Nerves: No cranial nerve deficit.     Sensory: No sensory deficit.  Psychiatric:        Mood and Affect: Mood is not anxious or depressed.        Speech: Speech normal.        Behavior: Behavior normal. Behavior is cooperative.        Judgment: Judgment normal.       Results for orders placed or performed in visit on 11/14/22  Lipid panel  Result Value Ref Range   Cholesterol 159 0 - 200 mg/dL   Triglycerides 146.0 0.0 - 149.0 mg/dL   HDL 50.90 >39.00 mg/dL   VLDL 29.2 0.0 - 40.0 mg/dL   LDL Cholesterol 79 0 - 99 mg/dL   Total CHOL/HDL Ratio 3    NonHDL 108.38   Comprehensive metabolic panel  Result Value Ref Range   Sodium 139 135 - 145 mEq/L   Potassium 4.2 3.5 - 5.1 mEq/L   Chloride 102 96 - 112 mEq/L   CO2 30 19 - 32 mEq/L   Glucose, Bld 96 70 - 99 mg/dL   BUN 14 6 - 23 mg/dL    Creatinine, Ser 0.86 0.40 - 1.20 mg/dL   Total  Bilirubin 0.6 0.2 - 1.2 mg/dL   Alkaline Phosphatase 55 39 - 117 U/L   AST 13 0 - 37 U/L   ALT 16 0 - 35 U/L   Total Protein 7.0 6.0 - 8.3 g/dL   Albumin 3.8 3.5 - 5.2 g/dL   GFR 71.51 >60.00 mL/min   Calcium 10.2 8.4 - 10.5 mg/dL  Hemoglobin A1c  Result Value Ref Range   Hgb A1c MFr Bld 6.1 4.6 - 6.5 %    This visit occurred during the SARS-CoV-2 public health emergency.  Safety protocols were in place, including screening questions prior to the visit, additional usage of staff PPE, and extensive cleaning of exam room while observing appropriate contact time as indicated for disinfecting solutions.   COVID 19 screen:  No recent travel or known exposure to COVID19 The patient denies respiratory symptoms of COVID 19 at this time. The importance of social distancing was discussed today.   Assessment and Plan The patient's preventative maintenance and recommended screening tests for an annual wellness exam were reviewed in full today. Brought up to date unless services declined.  Counselled on the importance of diet, exercise, and its role in overall health and mortality. The patient's FH and SH was reviewed, including their home life, tobacco status, and drug and alcohol status.   Vaccines:  given td and uptodate flu, shigrix Discussed COVID19 vaccine side effects and benefits. Strongly encouraged the patient to get the vaccine. Questions answered.   Pap/DVE:  Partial hysterectomy.. Followed by GYN every other year. Dr. Perlie Gold Mammo: 09/2022  normal Bone Density: Nml years ago. Plan repeat age 61. Low risk Colon: nml 01/2021 recommnded repeat in 7 years, Dr. Fuller Plan. Smoking Status: former,25 pack year history. Asymptomatic, nml spirometry, last CXR 2013 nml..  QUIT 2011 ETOH/ drug use: rare/none  Hep C:  done  HIV screen:   refused   Problem List Items Addressed This Visit     HYPERCHOLESTEROLEMIA, PURE    Stable, chronic.   Continue current medication.    simvastatin 40 mg daily      Prediabetes    Encouraged exercise, weight loss, healthy eating habits.       Other Visit Diagnoses     Routine general medical examination at a health care facility    -  Primary   Need for Td vaccine       Relevant Orders   Td : Tetanus/diphtheria >7yo Preservative  free (Completed)       No orders of the defined types were placed in this encounter.   Eliezer Lofts, MD

## 2022-11-22 NOTE — Assessment & Plan Note (Signed)
Stable, chronic.  Continue current medication.    simvastatin 40 mg daily. 

## 2022-11-22 NOTE — Assessment & Plan Note (Signed)
Encouraged exercise, weight loss, healthy eating habits. ? ?

## 2022-11-26 ENCOUNTER — Other Ambulatory Visit: Payer: Self-pay | Admitting: Family Medicine

## 2023-06-06 ENCOUNTER — Ambulatory Visit: Payer: 59 | Admitting: Family Medicine

## 2023-06-07 ENCOUNTER — Ambulatory Visit: Payer: 59 | Admitting: Internal Medicine

## 2023-06-07 ENCOUNTER — Encounter: Payer: Self-pay | Admitting: Internal Medicine

## 2023-06-07 VITALS — BP 114/72 | HR 82 | Temp 98.1°F | Ht 63.0 in | Wt 218.0 lb

## 2023-06-07 DIAGNOSIS — Z01818 Encounter for other preprocedural examination: Secondary | ICD-10-CM | POA: Insufficient documentation

## 2023-06-07 LAB — COMPREHENSIVE METABOLIC PANEL
ALT: 16 U/L (ref 0–35)
AST: 16 U/L (ref 0–37)
Albumin: 4.1 g/dL (ref 3.5–5.2)
Alkaline Phosphatase: 61 U/L (ref 39–117)
BUN: 13 mg/dL (ref 6–23)
CO2: 29 meq/L (ref 19–32)
Calcium: 9.8 mg/dL (ref 8.4–10.5)
Chloride: 103 meq/L (ref 96–112)
Creatinine, Ser: 0.82 mg/dL (ref 0.40–1.20)
GFR: 75.42 mL/min (ref >60.00)
Glucose, Bld: 91 mg/dL (ref 70–99)
Potassium: 4.4 meq/L (ref 3.5–5.1)
Sodium: 139 meq/L (ref 135–145)
Total Bilirubin: 0.7 mg/dL (ref 0.2–1.2)
Total Protein: 7.2 g/dL (ref 6.0–8.3)

## 2023-06-07 LAB — CBC
HCT: 41.2 % (ref 36.0–46.0)
Hemoglobin: 13.5 g/dL (ref 12.0–15.0)
MCHC: 32.7 g/dL (ref 30.0–36.0)
MCV: 86.8 fL (ref 78.0–100.0)
Platelets: 342 10*3/uL (ref 150.0–400.0)
RBC: 4.75 Mil/uL (ref 3.87–5.11)
RDW: 13.6 % (ref 11.5–15.5)
WBC: 7.2 10*3/uL (ref 4.0–10.5)

## 2023-06-07 NOTE — Assessment & Plan Note (Addendum)
Low risk medical history No worrisome features of history or exam EKG--sinus at 73. Left axis deviation. Normal intervals. No ischemia or hypertrophy. Normal variant. No prior EKG on file  Cleared for surgery Would recommend standard post op DVT prophylaxis---ASA okay if ambulates immediately post-op Discussed family support post op as expectation is that she will go home after the procedure Check requested labs---met c, CBC

## 2023-06-07 NOTE — Progress Notes (Signed)
Subjective:    Patient ID: Veronica Hawkins, female    DOB: [birth year], 62 y.o.   MRN: [id number]  HPI Here for pre-op evaluation prior to left THR by Dr Despina Hick  Had right hip replacement in 1989---then needed to be redone in 1999 This one is doing well still  Likes to walk--but not able to do this Is starting leg exercises---but has a lot of pain  No chest pain No palpitations No SOB Does house cleaning, etc---only wears out due to pain No edema No dizziness or syncope  Current Outpatient Medications on File Prior to Visit  Medication Sig Dispense Refill   aspirin 81 MG tablet Take 81 mg by mouth daily.     cetirizine (ZYRTEC) 10 MG tablet Take 10 mg by mouth daily.     Cholecalciferol (VITAMIN D3) 10000 units TABS Take 1 tablet by mouth daily.     clobetasol ointment (TEMOVATE) 0.05 % APPLY TO AFFECTED AREA ON THE SKIN TWICE A DAY AS NEEDED  3   cyclobenzaprine (FLEXERIL) 10 MG tablet Take 10 mg by mouth every 6 (six) hours as needed.     docusate sodium (COLACE) 100 MG capsule Take 200 mg by mouth at bedtime as needed for mild constipation.     methocarbamol (ROBAXIN) 500 MG tablet Take 1 tablet by mouth at bedtime as needed.     montelukast (SINGULAIR) 10 MG tablet TAKE 1 TABLET BY MOUTH EVERYDAY AT BEDTIME 90 tablet 3   simvastatin (ZOCOR) 40 MG tablet TAKE 1 TABLET BY MOUTH EVERY DAY 90 tablet 3   No current facility-administered medications on file prior to visit.    Allergies  Allergen Reactions   Sulfamethoxazole-Trimethoprim Hives    Past Medical History:  Diagnosis Date   Allergy    seasonal   Arthritis    Constipation    Hyperlipidemia    Vertigo     Past Surgical History:  Procedure Laterality Date   ABDOMINAL HYSTERECTOMY  1986   BREAST BIOPSY Left    BUNIONECTOMY Left    CARPAL TUNNEL RELEASE Bilateral    COLONOSCOPY     HIP SURGERY     right to remove bands   SHOULDER ARTHROSCOPY     left ligaments and tendon repair and spurs   TOTAL HIP  ARTHROPLASTY  [id number]   Right hip    Family History  Problem Relation Age of Onset   Colon polyps Other    Colon cancer Neg Hx    Esophageal cancer Neg Hx    Rectal cancer Neg Hx    Stomach cancer Neg Hx     Social History   Socioeconomic History   Marital status: Widowed    Spouse name: Not on file   Number of children: Not on file   Years of education: Not on file   Highest education level: 12th grade  Occupational History   Not on file  Tobacco Use   Smoking status: Former    Current packs/day: 0.00    Types: Cigarettes    Quit date: 03/16/2010    Years since quitting: 13.2   Smokeless tobacco: Never  Substance and Sexual Activity   Alcohol use: No   Drug use: No   Sexual activity: Not on file  Other Topics Concern   Not on file  Social History Narrative   Not on file   Social Determinants of Health   Financial Resource Strain: Low Risk  (06/02/2023)   Overall Financial Resource Strain (CARDIA)  Difficulty of Paying Living Expenses: Not hard at all  Food Insecurity: No Food Insecurity (06/02/2023)   Hunger Vital Sign    Worried About Running Out of Food in the Last Year: Never true    Ran Out of Food in the Last Year: Never true  Transportation Needs: No Transportation Needs (06/02/2023)   PRAPARE - Administrator, Civil Service (Medical): No    Lack of Transportation (Non-Medical): No  Physical Activity: Insufficiently Active (06/02/2023)   Exercise Vital Sign    Days of Exercise per Week: 1 day    Minutes of Exercise per Session: 10 min  Stress: No Stress Concern Present (06/02/2023)   [employer] of Occupational Health - Occupational Stress Questionnaire    Feeling of Stress : Not at all  Social Connections: Moderately Isolated (06/02/2023)   Social Connection and Isolation Panel [NHANES]    Frequency of Communication with Friends and Family: Three times a week    Frequency of Social Gatherings with Friends and Family: Once a week     Attends Religious Services: 1 to 4 times per year    Active Member of [employer] or Organizations: No    Attends Banker Meetings: Not on file    Marital Status: Widowed  Intimate Partner Violence: Not on file   Review of Systems Ight up 6# in past 2 years Sleeps okay     Objective:   Physical Exam Constitutional:      Appearance: Normal appearance.  Cardiovascular:     Rate and Rhythm: Normal rate and regular rhythm.     Pulses: Normal pulses.     Heart sounds: No murmur heard.    No gallop.  Pulmonary:     Effort: Pulmonary effort is normal.     Breath sounds: Normal breath sounds. No wheezing or rales.  Abdominal:     Palpations: Abdomen is soft.     Tenderness: There is no abdominal tenderness.  Musculoskeletal:     Cervical back: Neck supple.     Right lower leg: No edema.     Left lower leg: No edema.  Lymphadenopathy:     Cervical: No cervical adenopathy.  Neurological:     Mental Status: She is alert.  Psychiatric:        Mood and Affect: Mood normal.        Behavior: Behavior normal.            Assessment & Plan:

## 2023-07-13 ENCOUNTER — Other Ambulatory Visit: Payer: Self-pay | Admitting: Obstetrics and Gynecology

## 2023-07-13 DIAGNOSIS — Z1231 Encounter for screening mammogram for malignant neoplasm of breast: Secondary | ICD-10-CM

## 2023-10-02 ENCOUNTER — Ambulatory Visit
Admission: RE | Admit: 2023-10-02 | Discharge: 2023-10-02 | Disposition: A | Discharge status: Home / Self Care | Payer: Medicare Other | Source: Ambulatory Visit | Attending: Obstetrics and Gynecology | Admitting: Obstetrics and Gynecology

## 2023-10-02 DIAGNOSIS — Z1231 Encounter for screening mammogram for malignant neoplasm of breast: Secondary | ICD-10-CM

## 2023-10-30 ENCOUNTER — Telehealth: Payer: Self-pay | Admitting: *Deleted

## 2023-10-30 DIAGNOSIS — R7303 Prediabetes: Secondary | ICD-10-CM

## 2023-10-30 DIAGNOSIS — E7800 Pure hypercholesterolemia, unspecified: Secondary | ICD-10-CM

## 2023-10-30 NOTE — Telephone Encounter (Signed)
-----   Message from Lovena Neighbours sent at 10/30/2023  2:19 PM EST ----- Regarding: Labs for Friday 3.14.25 Please put physical lab orders in future. Thank you, Denny Peon

## 2023-11-11 ENCOUNTER — Other Ambulatory Visit: Payer: Self-pay | Admitting: Family Medicine

## 2023-11-17 ENCOUNTER — Encounter: Payer: Self-pay | Admitting: Family Medicine

## 2023-11-17 ENCOUNTER — Other Ambulatory Visit (INDEPENDENT_AMBULATORY_CARE_PROVIDER_SITE_OTHER): Payer: Medicare Other

## 2023-11-17 DIAGNOSIS — E7800 Pure hypercholesterolemia, unspecified: Secondary | ICD-10-CM

## 2023-11-17 DIAGNOSIS — R7303 Prediabetes: Secondary | ICD-10-CM

## 2023-11-17 LAB — COMPREHENSIVE METABOLIC PANEL
ALT: 18 U/L (ref 0–35)
AST: 17 U/L (ref 0–37)
Albumin: 4.4 g/dL (ref 3.5–5.2)
Alkaline Phosphatase: 58 U/L (ref 39–117)
BUN: 17 mg/dL (ref 6–23)
CO2: 27 meq/L (ref 19–32)
Calcium: 10 mg/dL (ref 8.4–10.5)
Chloride: 105 meq/L (ref 96–112)
Creatinine, Ser: 0.79 mg/dL (ref 0.40–1.20)
GFR: 78.62 mL/min (ref >60.00)
Glucose, Bld: 108 mg/dL — ABNORMAL HIGH (ref 70–99)
Potassium: 4.1 meq/L (ref 3.5–5.1)
Sodium: 141 meq/L (ref 135–145)
Total Bilirubin: 0.5 mg/dL (ref 0.2–1.2)
Total Protein: 7.3 g/dL (ref 6.0–8.3)

## 2023-11-17 LAB — HEMOGLOBIN A1C: Hgb A1c MFr Bld: 6.3 % (ref 4.6–6.5)

## 2023-11-17 LAB — LIPID PANEL
Cholesterol: 170 mg/dL (ref 0–200)
HDL: 50.5 mg/dL (ref >39.00)
LDL Cholesterol: 93 mg/dL (ref 0–99)
NonHDL: 119.1
Total CHOL/HDL Ratio: 3
Triglycerides: 129 mg/dL (ref 0.0–149.0)
VLDL: 25.8 mg/dL (ref 0.0–40.0)

## 2023-11-17 NOTE — Progress Notes (Signed)
 No critical labs need to be addressed urgently. We will discuss labs in detail at upcoming office visit.

## 2023-11-24 ENCOUNTER — Encounter: Payer: Self-pay | Admitting: Family Medicine

## 2023-11-24 ENCOUNTER — Ambulatory Visit: Payer: 59 | Admitting: Family Medicine

## 2023-11-24 VITALS — BP 130/80 | HR 80 | Temp 97.5°F | Ht 63.0 in | Wt 218.4 lb

## 2023-11-24 DIAGNOSIS — E66812 Obesity, class 2: Secondary | ICD-10-CM

## 2023-11-24 DIAGNOSIS — E7800 Pure hypercholesterolemia, unspecified: Secondary | ICD-10-CM

## 2023-11-24 DIAGNOSIS — R7303 Prediabetes: Secondary | ICD-10-CM | POA: Diagnosis not present

## 2023-11-24 DIAGNOSIS — Z23 Encounter for immunization: Secondary | ICD-10-CM

## 2023-11-24 DIAGNOSIS — Z6838 Body mass index (BMI) 38.0-38.9, adult: Secondary | ICD-10-CM

## 2023-11-24 DIAGNOSIS — E6609 Other obesity due to excess calories: Secondary | ICD-10-CM

## 2023-11-24 DIAGNOSIS — Z0000 Encounter for general adult medical examination without abnormal findings: Secondary | ICD-10-CM | POA: Diagnosis not present

## 2023-11-24 MED ORDER — TIRZEPATIDE-WEIGHT MANAGEMENT 2.5 MG/0.5ML ~~LOC~~ SOLN: 2.5000 mg | SUBCUTANEOUS | 0 refills | Status: DC

## 2023-11-24 NOTE — Progress Notes (Signed)
 Patient ID: Veronica Hawkins, female    DOB: 02/28/1961, 63 y.o.   MRN: [id number]  This visit was conducted in person.  BP 130/80 (BP Location: Left Arm, Patient Position: Sitting, Cuff Size: Large)   Pulse 80   Temp (!) 97.5 F (36.4 C) (Temporal)   Ht 5\' 3"  (1.6 m)   Wt 218 lb 6 oz (99.1 kg)   SpO2 96%   BMI 38.68 kg/m    CC:  Chief Complaint  Patient presents with   Welcome to Medicare    Subjective:   HPI: Veronica Hawkins is a 63 y.o. female presenting on 11/24/2023 for  welcome to medicare wellness. The patient presents for   welcome to medicare wellness, complete physical and review of chronic health problems. He/She also has the following acute concerns today: currently treated with nitrofurantoin for UTI... improving.   Elevated Cholesterol: At goal on simvastatin 40 mg daily Lab Results  Component Value Date   CHOL 170 11/17/2023   HDL 50.50 11/17/2023   LDLCALC 93 11/17/2023   LDLDIRECT 173.2 03/19/2007   TRIG 129.0 11/17/2023   CHOLHDL 3 11/17/2023  Using medications without problems: none Muscle aches:  none Diet compliance: moderate Exercise: walking.. doing more since  hip replacement. Other complaints:  Wt Readings from Last 3 Encounters:  11/24/23 218 lb 6 oz (99.1 kg)  06/07/23 218 lb (98.9 kg)  11/22/22 215 lb 6 oz (97.7 kg)     Prediabetes   Gradually worsening control Lab Results  Component Value Date   HGBA1C 6.3 11/17/2023    Chronic insomnia:  Improved with night tea... no longer needing trazodone.     Relevant past medical, surgical, family and social history reviewed and updated as indicated. Interim medical history since our last visit reviewed. Allergies and medications reviewed and updated. Outpatient Medications Prior to Visit  Medication Sig Dispense Refill   aspirin 81 MG tablet Take 81 mg by mouth daily.     augmented betamethasone dipropionate (DIPROLENE-AF) 0.05 % ointment Apply 1 Application topically 2 (two) times  daily as needed.     cetirizine (ZYRTEC) 10 MG tablet Take 10 mg by mouth daily.     Cholecalciferol (VITAMIN D3) 10000 units TABS Take 1 tablet by mouth daily.     clobetasol ointment (TEMOVATE) 0.05 % APPLY TO AFFECTED AREA ON THE SKIN TWICE A DAY AS NEEDED  3   methocarbamol (ROBAXIN) 500 MG tablet Take 1 tablet by mouth at bedtime as needed.     montelukast (SINGULAIR) 10 MG tablet TAKE 1 TABLET BY MOUTH EVERYDAY AT BEDTIME 90 tablet 0   nitrofurantoin, macrocrystal-monohydrate, (MACROBID) 100 MG capsule Take 100 mg by mouth 2 (two) times daily.     simvastatin (ZOCOR) 40 MG tablet TAKE 1 TABLET BY MOUTH EVERY DAY 90 tablet 0   cyclobenzaprine (FLEXERIL) 10 MG tablet Take 10 mg by mouth every 6 (six) hours as needed.     docusate sodium (COLACE) 100 MG capsule Take 200 mg by mouth at bedtime as needed for mild constipation.     No facility-administered medications prior to visit.     Per HPI unless specifically indicated in ROS section below Review of Systems  Constitutional:  Negative for fatigue and fever.  HENT:  Negative for congestion.   Eyes:  Negative for pain.  Respiratory:  Negative for cough and shortness of breath.   Cardiovascular:  Negative for chest pain, palpitations and leg swelling.  Gastrointestinal:  Negative for abdominal  pain.  Genitourinary:  Negative for dysuria and vaginal bleeding.  Musculoskeletal:  Negative for back pain.  Neurological:  Negative for syncope, light-headedness and headaches.  Psychiatric/Behavioral:  Negative for dysphoric mood.    Objective:  BP 130/80 (BP Location: Left Arm, Patient Position: Sitting, Cuff Size: Large)   Pulse 80   Temp (!) 97.5 F (36.4 C) (Temporal)   Ht 5\' 3"  (1.6 m)   Wt 218 lb 6 oz (99.1 kg)   SpO2 96%   BMI 38.68 kg/m   Wt Readings from Last 3 Encounters:  11/24/23 218 lb 6 oz (99.1 kg)  06/07/23 218 lb (98.9 kg)  11/22/22 215 lb 6 oz (97.7 kg)      Physical Exam Vitals and nursing note reviewed.   Constitutional:      General: She is not in acute distress.    Appearance: Normal appearance. She is well-developed. She is not ill-appearing or toxic-appearing.  HENT:     Head: Normocephalic.     Right Ear: Hearing, tympanic membrane, ear canal and external ear normal.     Left Ear: Hearing, tympanic membrane, ear canal and external ear normal.     Nose: Nose normal.  Eyes:     General: Lids are normal. Lids are everted, no foreign bodies appreciated.     Conjunctiva/sclera: Conjunctivae normal.     Pupils: Pupils are equal, round, and reactive to light.  Neck:     Thyroid: No thyroid mass or thyromegaly.     Vascular: No carotid bruit.     Trachea: Trachea normal.  Cardiovascular:     Rate and Rhythm: Normal rate and regular rhythm.     Heart sounds: Normal heart sounds, S1 normal and S2 normal. No murmur heard.    No gallop.  Pulmonary:     Effort: Pulmonary effort is normal. No respiratory distress.     Breath sounds: Normal breath sounds. No wheezing, rhonchi or rales.  Abdominal:     General: Bowel sounds are normal. There is no distension or abdominal bruit.     Palpations: Abdomen is soft. There is no fluid wave or mass.     Tenderness: There is no abdominal tenderness. There is no guarding or rebound.     Hernia: No hernia is present.  Musculoskeletal:     Cervical back: Normal range of motion and neck supple.  Lymphadenopathy:     Cervical: No cervical adenopathy.  Skin:    General: Skin is warm and dry.     Findings: No rash.  Neurological:     Mental Status: She is alert.     Cranial Nerves: No cranial nerve deficit.     Sensory: No sensory deficit.  Psychiatric:        Mood and Affect: Mood is not anxious or depressed.        Speech: Speech normal.        Behavior: Behavior normal. Behavior is cooperative.        Judgment: Judgment normal.       Results for orders placed or performed in visit on 11/17/23  Hemoglobin A1c   Collection Time: 11/17/23   8:59 AM  Result Value Ref Range   Hgb A1c MFr Bld 6.3 4.6 - 6.5 %  Lipid panel   Collection Time: 11/17/23  8:59 AM  Result Value Ref Range   Cholesterol 170 0 - 200 mg/dL   Triglycerides 440.1 0.0 - 149.0 mg/dL   HDL 02.72 >53.66 mg/dL   VLDL 25.8  0.0 - 40.0 mg/dL   LDL Cholesterol 93 0 - 99 mg/dL   Total CHOL/HDL Ratio 3    NonHDL 119.10   Comprehensive metabolic panel   Collection Time: 11/17/23  8:59 AM  Result Value Ref Range   Sodium 141 135 - 145 mEq/L   Potassium 4.1 3.5 - 5.1 mEq/L   Chloride 105 96 - 112 mEq/L   CO2 27 19 - 32 mEq/L   Glucose, Bld 108 (H) 70 - 99 mg/dL   BUN 17 6 - 23 mg/dL   Creatinine, Ser 2.13 0.40 - 1.20 mg/dL   Total Bilirubin 0.5 0.2 - 1.2 mg/dL   Alkaline Phosphatase 58 39 - 117 U/L   AST 17 0 - 37 U/L   ALT 18 0 - 35 U/L   Total Protein 7.3 6.0 - 8.3 g/dL   Albumin 4.4 3.5 - 5.2 g/dL   GFR 08.65 >78.46 mL/min   Calcium 10.0 8.4 - 10.5 mg/dL    This visit occurred during the SARS-CoV-2 public health emergency.  Safety protocols were in place, including screening questions prior to the visit, additional usage of staff PPE, and extensive cleaning of exam room while observing appropriate contact time as indicated for disinfecting solutions.   COVID 19 screen:  No recent travel or known exposure to COVID19 The patient denies respiratory symptoms of COVID 19 at this time. The importance of social distancing was discussed today.   Assessment and Plan The patient's preventative maintenance and recommended screening tests for an annual wellness exam were reviewed in full today. Brought up to date unless services declined.  Counselled on the importance of diet, exercise, and its role in overall health and mortality. The patient's FH and SH was reviewed, including their home life, tobacco status, and drug and alcohol status.   Vaccines:  Uptodate flu, shigrix, Tdap,Due for prevnar 20. Pap/DVE:  Partial hysterectomy.. Followed by GYN every other  year. Dr. Newt Minion Mammo: 09/2023  normal Bone Density: Nml years ago. Plan repeat age 2. Low risk.. DUE.. plans with GYN Colon: nml 01/2021 recommnded repeat in 7 years, Dr. Russella Dar. Smoking Status: former,25 pack year history. Asymptomatic, nml spirometry, last CXR 2013 nml..  QUIT 2011 ETOH/ drug use: rare/none  Hep C:  done  HIV screen:   refused   Problem List Items Addressed This Visit     Class 2 obesity due to excess calories without serious comorbidity with body mass index (BMI) of 38.0 to 38.9 in adult   Encouraged exercise, weight loss, healthy eating habits. She has been working on dietary changes, decreased portion size and increasing regular activity since her hip replacement.  She has struggled to lose weight. She has prediabetes that is worsening with an A1c that is almost in the diabetic range. I think she would benefit significantly from a GLP-1 medication.  Unfortunately this will not be covered by Medicare. I will prescribe self-pay Zepbound through [employer] [facility] at 2.5 mg weekly.  She is agreeable to the cost.  Reviewed possible side effects and expected course. She will follow-up in 1 month after starting this medication.       Relevant Medications   tirzepatide (ZEPBOUND) 2.5 MG/0.5ML injection vial   HYPERCHOLESTEROLEMIA, PURE   Stable, chronic.  Continue current medication.    simvastatin 40 mg daily      Prediabetes   Encouraged exercise, weight loss, healthy eating habits.       Other Visit Diagnoses       Welcome to [facility]  preventive visit    -  Primary     Need for vaccination against Streptococcus pneumoniae       Relevant Orders   Pneumococcal conjugate vaccine 20-valent (Prevnar 20)        Meds ordered this encounter  Medications   tirzepatide (ZEPBOUND) 2.5 MG/0.5ML injection vial    Sig: Inject 2.5 mg into the skin once a week.    Dispense:  2 mL    Refill:  0    Contact/text patient at  [phone],  BMI 38    Kerby Nora,  MD

## 2023-11-24 NOTE — Assessment & Plan Note (Addendum)
 Encouraged exercise, weight loss, healthy eating habits. She has been working on dietary changes, decreased portion size and increasing regular activity since her hip replacement.  She has struggled to lose weight. She has prediabetes that is worsening with an A1c that is almost in the diabetic range. I think she would benefit significantly from a GLP-1 medication.  Unfortunately this will not be covered by Medicare. I will prescribe self-pay Zepbound through [facility] at 2.5 mg weekly.  She is agreeable to the cost.  Reviewed possible side effects and expected course. She will follow-up in 1 month after starting this medication.

## 2023-11-24 NOTE — Assessment & Plan Note (Signed)
Stable, chronic.  Continue current medication.    simvastatin 40 mg daily. 

## 2023-11-24 NOTE — Patient Instructions (Signed)
 Start Zepbound 2/5 mg weekly Work on lifestyle changes

## 2023-11-24 NOTE — Assessment & Plan Note (Signed)
 Encouraged exercise, weight loss, healthy eating habits. ? ?

## 2023-12-12 ENCOUNTER — Other Ambulatory Visit: Payer: Self-pay | Admitting: Family Medicine

## 2023-12-12 ENCOUNTER — Other Ambulatory Visit: Payer: Self-pay | Admitting: Obstetrics and Gynecology

## 2023-12-12 DIAGNOSIS — E2839 Other primary ovarian failure: Secondary | ICD-10-CM

## 2023-12-12 DIAGNOSIS — R300 Dysuria: Secondary | ICD-10-CM | POA: Diagnosis not present

## 2023-12-12 NOTE — Telephone Encounter (Signed)
 Last office visit 11/24/2023 for Welcome to Medicare.  Last refilled 11/24/2023 for 2 ml.  AVS states to follow up in 4 weeks.  Next APPT: 12/20/2023.  Refill?

## 2023-12-20 ENCOUNTER — Ambulatory Visit: Admitting: Family Medicine

## 2023-12-21 ENCOUNTER — Ambulatory Visit: Admitting: Family Medicine

## 2023-12-26 ENCOUNTER — Ambulatory Visit: Admitting: Family Medicine

## 2023-12-27 ENCOUNTER — Ambulatory Visit (INDEPENDENT_AMBULATORY_CARE_PROVIDER_SITE_OTHER): Admitting: Family Medicine

## 2023-12-27 ENCOUNTER — Encounter: Payer: Self-pay | Admitting: Family Medicine

## 2023-12-27 VITALS — BP 128/68 | HR 67 | Temp 98.5°F | Ht 63.0 in | Wt 218.2 lb

## 2023-12-27 DIAGNOSIS — E66812 Obesity, class 2: Secondary | ICD-10-CM

## 2023-12-27 DIAGNOSIS — E6609 Other obesity due to excess calories: Secondary | ICD-10-CM

## 2023-12-27 DIAGNOSIS — K219 Gastro-esophageal reflux disease without esophagitis: Secondary | ICD-10-CM | POA: Insufficient documentation

## 2023-12-27 DIAGNOSIS — Z6838 Body mass index (BMI) 38.0-38.9, adult: Secondary | ICD-10-CM

## 2023-12-27 MED ORDER — TIRZEPATIDE-WEIGHT MANAGEMENT 5 MG/0.5ML ~~LOC~~ SOLN: 5.0000 mg | SUBCUTANEOUS | 3 refills | Status: DC

## 2023-12-27 MED ORDER — PANTOPRAZOLE SODIUM 40 MG PO TBEC: 40.0000 mg | DELAYED_RELEASE_TABLET | Freq: Every day | ORAL | 0 refills | Status: DC

## 2023-12-27 NOTE — Progress Notes (Signed)
 Patient ID: Veronica Hawkins, female    DOB: 03/19/1961, 63 y.o.   MRN: [id number]  This visit was conducted in person.  BP 128/68   Pulse 67   Temp 98.5 F (36.9 C) (Oral)   Ht 5\' 3"  (1.6 m)   Wt 218 lb 3.2 oz (99 kg)   SpO2 97%   BMI 38.65 kg/m    CC:  Chief Complaint  Patient presents with   Follow-up    Zepbound   Gastroesophageal Reflux    Occurring more often. Has been taking a lot of Tums    Subjective:   HPI: Veronica Hawkins is a 63 y.o. female presenting on 12/27/2023 for Follow-up (Zepbound) and Gastroesophageal Reflux (Occurring more often. Has been taking a lot of Tums)   New start self Pay Zepbound on 2.5 mg weekly... has been on for 1 month... on dose 5 .  Increase in GERD since being on Zepbound, was having it prior. She has noted mild decrease in appetite. Acid coming up in  throat. Noting at night.  She is  using Tums daily.   She is decreasing meal size.  She is  trying to go walking 5 days a week. 2-3 miles  No weight change Wt Readings from Last 3 Encounters:  12/27/23 218 lb 3.2 oz (99 kg)  11/24/23 218 lb 6 oz (99.1 kg)  06/07/23 218 lb (98.9 kg)        Relevant past medical, surgical, family and social history reviewed and updated as indicated. Interim medical history since our last visit reviewed. Allergies and medications reviewed and updated. Outpatient Medications Prior to Visit  Medication Sig Dispense Refill   aspirin 81 MG tablet Take 81 mg by mouth daily.     augmented betamethasone dipropionate (DIPROLENE-AF) 0.05 % ointment Apply 1 Application topically 2 (two) times daily as needed.     cetirizine (ZYRTEC) 10 MG tablet Take 10 mg by mouth daily.     Cholecalciferol (VITAMIN D3) 10000 units TABS Take 1 tablet by mouth daily.     clobetasol ointment (TEMOVATE) 0.05 % APPLY TO AFFECTED AREA ON THE SKIN TWICE A DAY AS NEEDED  3   methocarbamol (ROBAXIN) 500 MG tablet Take 1 tablet by mouth at bedtime as needed.     montelukast   (SINGULAIR ) 10 MG tablet TAKE 1 TABLET BY MOUTH EVERYDAY AT BEDTIME 90 tablet 0   nitrofurantoin, macrocrystal-monohydrate, (MACROBID) 100 MG capsule Take 100 mg by mouth 2 (two) times daily.     simvastatin  (ZOCOR ) 40 MG tablet TAKE 1 TABLET BY MOUTH EVERY DAY 90 tablet 0   ZEPBOUND 2.5 MG/0.5ML injection vial INJECT 0.5 ML (2.5 MG) UNDER THE SKIN ONCE WEEKLY (0.5ML= 50 UNITS) 2 mL 0   fluocinonide cream (LIDEX) 0.05 % Apply 1 Application topically 2 (two) times daily.     No facility-administered medications prior to visit.     Per HPI unless specifically indicated in ROS section below Review of Systems  Constitutional:  Negative for fatigue and fever.  HENT:  Negative for congestion.   Eyes:  Negative for pain.  Respiratory:  Negative for cough and shortness of breath.   Cardiovascular:  Negative for chest pain, palpitations and leg swelling.  Gastrointestinal:  Negative for abdominal pain.  Genitourinary:  Negative for dysuria and vaginal bleeding.  Musculoskeletal:  Negative for back pain.  Neurological:  Negative for syncope, light-headedness and headaches.  Psychiatric/Behavioral:  Negative for dysphoric mood.    Objective:  BP  128/68   Pulse 67   Temp 98.5 F (36.9 C) (Oral)   Ht 5\' 3"  (1.6 m)   Wt 218 lb 3.2 oz (99 kg)   SpO2 97%   BMI 38.65 kg/m   Wt Readings from Last 3 Encounters:  12/27/23 218 lb 3.2 oz (99 kg)  11/24/23 218 lb 6 oz (99.1 kg)  06/07/23 218 lb (98.9 kg)      Physical Exam Constitutional:      General: She is not in acute distress.    Appearance: Normal appearance. She is well-developed. She is obese. She is not ill-appearing or toxic-appearing.  HENT:     Head: Normocephalic.     Right Ear: Hearing, tympanic membrane, ear canal and external ear normal. Tympanic membrane is not erythematous, retracted or bulging.     Left Ear: Hearing, tympanic membrane, ear canal and external ear normal. Tympanic membrane is not erythematous, retracted or  bulging.     Nose: No mucosal edema or rhinorrhea.     Right Sinus: No maxillary sinus tenderness or frontal sinus tenderness.     Left Sinus: No maxillary sinus tenderness or frontal sinus tenderness.     Mouth/Throat:     Pharynx: Uvula midline.  Eyes:     General: Lids are normal. Lids are everted, no foreign bodies appreciated.     Conjunctiva/sclera: Conjunctivae normal.     Pupils: Pupils are equal, round, and reactive to light.  Neck:     Thyroid: No thyroid mass or thyromegaly.     Vascular: No carotid bruit.     Trachea: Trachea normal.  Cardiovascular:     Rate and Rhythm: Normal rate and regular rhythm.     Pulses: Normal pulses.     Heart sounds: Normal heart sounds, S1 normal and S2 normal. No murmur heard.    No friction rub. No gallop.  Pulmonary:     Effort: Pulmonary effort is normal. No tachypnea or respiratory distress.     Breath sounds: Normal breath sounds. No decreased breath sounds, wheezing, rhonchi or rales.  Abdominal:     General: Bowel sounds are normal.     Palpations: Abdomen is soft.     Tenderness: There is no abdominal tenderness.  Musculoskeletal:     Cervical back: Normal range of motion and neck supple.  Skin:    General: Skin is warm and dry.     Findings: No rash.  Neurological:     Mental Status: She is alert.  Psychiatric:        Mood and Affect: Mood is not anxious or depressed.        Speech: Speech normal.        Behavior: Behavior normal. Behavior is cooperative.        Thought Content: Thought content normal.        Judgment: Judgment normal.       Results for orders placed or performed in visit on 11/17/23  Hemoglobin A1c   Collection Time: 11/17/23  8:59 AM  Result Value Ref Range   Hgb A1c MFr Bld 6.3 4.6 - 6.5 %  Lipid panel   Collection Time: 11/17/23  8:59 AM  Result Value Ref Range   Cholesterol 170 0 - 200 mg/dL   Triglycerides 098.1 0.0 - 149.0 mg/dL   HDL 19.14 >78.29 mg/dL   VLDL 56.2 0.0 - 13.0 mg/dL   LDL  Cholesterol 93 0 - 99 mg/dL   Total CHOL/HDL Ratio 3    NonHDL 119.10  Comprehensive metabolic panel   Collection Time: 11/17/23  8:59 AM  Result Value Ref Range   Sodium 141 135 - 145 mEq/L   Potassium 4.1 3.5 - 5.1 mEq/L   Chloride 105 96 - 112 mEq/L   CO2 27 19 - 32 mEq/L   Glucose, Bld 108 (H) 70 - 99 mg/dL   BUN 17 6 - 23 mg/dL   Creatinine, Ser 1.61 0.40 - 1.20 mg/dL   Total Bilirubin 0.5 0.2 - 1.2 mg/dL   Alkaline Phosphatase 58 39 - 117 U/L   AST 17 0 - 37 U/L   ALT 18 0 - 35 U/L   Total Protein 7.3 6.0 - 8.3 g/dL   Albumin 4.4 3.5 - 5.2 g/dL   GFR 09.60 >45.40 mL/min   Calcium 10.0 8.4 - 10.5 mg/dL    Assessment and Plan  Gastroesophageal reflux disease, unspecified whether esophagitis present Assessment & Plan: Chronic with acute worsening.  Discussed dietary changes including decreasing acidic foods, alcohol and carbonation avoidance.  She will continue working on weight loss. We will start pantoprazole  40 mg daily, plan longer course given she will be on Zepbound for a while.   Class 2 obesity due to excess calories without serious comorbidity with body mass index (BMI) of 38.0 to 38.9 in adult Assessment & Plan: Chronic, she is tolerating low-dose Zepbound fairly well but is having associated reflux.  Of note she was having this prior although better controlled at that time. We will move forward in increasing Zepbound vial to 5 mg weekly through [employer] [facility]. She denies any upper abdominal pain.  She will continue to work on decreasing meal size, heart healthy diet and increase in exercise.  She plans to go walking 5 days a week 2 to 3 miles each time.   Other orders -     Pantoprazole  Sodium; Take 1 tablet (40 mg total) by mouth daily.  Dispense: 90 tablet; Refill: 0 -     Tirzepatide-Weight Management; Inject 5 mg into the skin once a week.  Dispense: 2 mL; Refill: 3    No follow-ups on file.   Herby Lolling, MD

## 2023-12-27 NOTE — Assessment & Plan Note (Signed)
 Chronic, she is tolerating low-dose Zepbound fairly well but is having associated reflux.  Of note she was having this prior although better controlled at that time. We will move forward in increasing Zepbound vial to 5 mg weekly through [facility] [employer] [facility]. She denies any upper abdominal pain.  She will continue to work on decreasing meal size, heart healthy diet and increase in exercise.  She plans to go walking 5 days a week 2 to 3 miles each time.

## 2023-12-27 NOTE — Assessment & Plan Note (Signed)
 Chronic with acute worsening.  Discussed dietary changes including decreasing acidic foods, alcohol and carbonation avoidance.  She will continue working on weight loss. We will start pantoprazole  40 mg daily, plan longer course given she will be on Zepbound for a while.

## 2023-12-29 ENCOUNTER — Other Ambulatory Visit: Payer: Self-pay | Admitting: Family Medicine

## 2024-02-11 ENCOUNTER — Other Ambulatory Visit: Payer: Self-pay | Admitting: Family Medicine

## 2024-02-28 DIAGNOSIS — D2262 Melanocytic nevi of left upper limb, including shoulder: Secondary | ICD-10-CM | POA: Diagnosis not present

## 2024-02-28 DIAGNOSIS — L821 Other seborrheic keratosis: Secondary | ICD-10-CM | POA: Diagnosis not present

## 2024-02-28 DIAGNOSIS — D485 Neoplasm of uncertain behavior of skin: Secondary | ICD-10-CM | POA: Diagnosis not present

## 2024-02-28 DIAGNOSIS — D225 Melanocytic nevi of trunk: Secondary | ICD-10-CM | POA: Diagnosis not present

## 2024-02-28 DIAGNOSIS — L814 Other melanin hyperpigmentation: Secondary | ICD-10-CM | POA: Diagnosis not present

## 2024-02-28 DIAGNOSIS — D2271 Melanocytic nevi of right lower limb, including hip: Secondary | ICD-10-CM | POA: Diagnosis not present

## 2024-03-12 DIAGNOSIS — L988 Other specified disorders of the skin and subcutaneous tissue: Secondary | ICD-10-CM | POA: Diagnosis not present

## 2024-03-12 DIAGNOSIS — D485 Neoplasm of uncertain behavior of skin: Secondary | ICD-10-CM | POA: Diagnosis not present

## 2024-03-22 ENCOUNTER — Encounter: Payer: Self-pay | Admitting: Advanced Practice Midwife

## 2024-03-23 ENCOUNTER — Other Ambulatory Visit: Payer: Self-pay | Admitting: Family Medicine

## 2024-04-02 ENCOUNTER — Encounter: Payer: Self-pay | Admitting: Family Medicine

## 2024-04-03 MED ORDER — TIRZEPATIDE-WEIGHT MANAGEMENT 7.5 MG/0.5ML ~~LOC~~ SOLN
7.5000 mg | SUBCUTANEOUS | 2 refills | Status: DC
Start: 2024-04-03 — End: 2024-06-11

## 2024-06-07 ENCOUNTER — Other Ambulatory Visit: Payer: Self-pay | Admitting: Family Medicine

## 2024-06-10 NOTE — Telephone Encounter (Signed)
 Last office visit 12/27/23 for GERD and Obesity.  Last refilled 04/03/24 for 2 ml with 2 refills to [facility].  Next Appt: No future appointments with PCP.

## 2024-06-21 ENCOUNTER — Other Ambulatory Visit: Payer: Self-pay | Admitting: Family Medicine

## 2024-08-20 ENCOUNTER — Other Ambulatory Visit

## 2024-09-06 ENCOUNTER — Other Ambulatory Visit: Payer: Self-pay | Admitting: Family Medicine

## 2024-09-06 NOTE — Telephone Encounter (Signed)
 Last office visit 12/27/23 for GERD.  Last refilled 06/11/24 for 2 ml with 2 refills.  Next Appt: No future appointments.

## 2024-09-06 NOTE — Telephone Encounter (Signed)
 Spoke with Veronica Hawkins.  She thinks she has lost 12 lbs total.  Current weight 202 lbs.  Only side effect is after she gives herself this injection the site breaks out.   She is okay going up to the 10 mg.

## 2024-09-09 ENCOUNTER — Other Ambulatory Visit: Payer: Self-pay | Admitting: Family Medicine

## 2024-09-11 ENCOUNTER — Other Ambulatory Visit (HOSPITAL_BASED_OUTPATIENT_CLINIC_OR_DEPARTMENT_OTHER): Payer: Self-pay | Admitting: Family Medicine

## 2024-09-11 ENCOUNTER — Ambulatory Visit (HOSPITAL_BASED_OUTPATIENT_CLINIC_OR_DEPARTMENT_OTHER)
Admission: RE | Admit: 2024-09-11 | Discharge: 2024-09-11 | Disposition: A | Discharge status: Home / Self Care | Source: Ambulatory Visit | Attending: Obstetrics and Gynecology | Admitting: Obstetrics and Gynecology

## 2024-09-11 DIAGNOSIS — E2839 Other primary ovarian failure: Secondary | ICD-10-CM | POA: Diagnosis present

## 2024-09-11 DIAGNOSIS — Z1231 Encounter for screening mammogram for malignant neoplasm of breast: Secondary | ICD-10-CM

## 2024-10-18 ENCOUNTER — Ambulatory Visit (HOSPITAL_BASED_OUTPATIENT_CLINIC_OR_DEPARTMENT_OTHER): Admitting: Radiology

## 2024-11-26 ENCOUNTER — Encounter: Admitting: Family Medicine
